# Patient Record
Sex: Female | Born: 1955 | Race: Black or African American | Hispanic: No | Marital: Single | Smoking: Never smoker
Health system: Southern US, Community
[De-identification: ages and names within clinical notes are randomized; demographics above are authoritative.]

## PROBLEM LIST (undated history)

## (undated) DIAGNOSIS — G4730 Sleep apnea, unspecified: Secondary | ICD-10-CM

## (undated) DIAGNOSIS — Z8489 Family history of other specified conditions: Secondary | ICD-10-CM

## (undated) DIAGNOSIS — I10 Essential (primary) hypertension: Secondary | ICD-10-CM

## (undated) DIAGNOSIS — F329 Major depressive disorder, single episode, unspecified: Secondary | ICD-10-CM

## (undated) DIAGNOSIS — Z8619 Personal history of other infectious and parasitic diseases: Secondary | ICD-10-CM

## (undated) DIAGNOSIS — K859 Acute pancreatitis without necrosis or infection, unspecified: Secondary | ICD-10-CM

## (undated) DIAGNOSIS — M1990 Unspecified osteoarthritis, unspecified site: Secondary | ICD-10-CM

## (undated) DIAGNOSIS — F32A Depression, unspecified: Secondary | ICD-10-CM

## (undated) DIAGNOSIS — F419 Anxiety disorder, unspecified: Secondary | ICD-10-CM

## (undated) HISTORY — DX: Essential (primary) hypertension: I10

## (undated) HISTORY — PX: HARDWARE REMOVAL: SHX979

## (undated) HISTORY — DX: Depression, unspecified: F32.A

## (undated) HISTORY — DX: Personal history of other infectious and parasitic diseases: Z86.19

## (undated) HISTORY — PX: KNEE SURGERY: SHX244

## (undated) HISTORY — DX: Major depressive disorder, single episode, unspecified: F32.9

---

## 2006-08-19 ENCOUNTER — Other Ambulatory Visit: Payer: Self-pay

## 2006-08-19 ENCOUNTER — Inpatient Hospital Stay: Payer: Self-pay | Admitting: Internal Medicine

## 2006-08-20 ENCOUNTER — Other Ambulatory Visit: Payer: Self-pay

## 2007-10-11 ENCOUNTER — Ambulatory Visit: Payer: Self-pay | Admitting: Internal Medicine

## 2007-11-29 ENCOUNTER — Ambulatory Visit: Payer: Self-pay | Admitting: Internal Medicine

## 2008-04-08 ENCOUNTER — Ambulatory Visit: Payer: Self-pay

## 2008-08-15 ENCOUNTER — Emergency Department: Payer: Self-pay | Admitting: Emergency Medicine

## 2009-01-15 DIAGNOSIS — I10 Essential (primary) hypertension: Secondary | ICD-10-CM | POA: Insufficient documentation

## 2009-01-15 DIAGNOSIS — G4730 Sleep apnea, unspecified: Secondary | ICD-10-CM | POA: Insufficient documentation

## 2009-01-15 DIAGNOSIS — E119 Type 2 diabetes mellitus without complications: Secondary | ICD-10-CM | POA: Insufficient documentation

## 2009-01-29 DIAGNOSIS — K219 Gastro-esophageal reflux disease without esophagitis: Secondary | ICD-10-CM | POA: Insufficient documentation

## 2009-04-08 DIAGNOSIS — R202 Paresthesia of skin: Secondary | ICD-10-CM | POA: Insufficient documentation

## 2009-09-07 DIAGNOSIS — J309 Allergic rhinitis, unspecified: Secondary | ICD-10-CM | POA: Insufficient documentation

## 2010-06-30 ENCOUNTER — Ambulatory Visit: Payer: Self-pay

## 2010-07-22 ENCOUNTER — Encounter: Payer: Self-pay | Admitting: Cardiovascular Disease

## 2010-07-29 ENCOUNTER — Encounter: Payer: Self-pay | Admitting: Cardiovascular Disease

## 2010-08-17 ENCOUNTER — Encounter: Payer: Self-pay | Admitting: Cardiovascular Disease

## 2010-08-17 ENCOUNTER — Ambulatory Visit (INDEPENDENT_AMBULATORY_CARE_PROVIDER_SITE_OTHER): Payer: Self-pay | Admitting: Cardiovascular Disease

## 2010-08-17 DIAGNOSIS — E669 Obesity, unspecified: Secondary | ICD-10-CM

## 2010-08-17 DIAGNOSIS — R0602 Shortness of breath: Secondary | ICD-10-CM | POA: Insufficient documentation

## 2010-08-17 DIAGNOSIS — E119 Type 2 diabetes mellitus without complications: Secondary | ICD-10-CM | POA: Insufficient documentation

## 2010-08-17 DIAGNOSIS — I10 Essential (primary) hypertension: Secondary | ICD-10-CM | POA: Insufficient documentation

## 2010-08-17 DIAGNOSIS — I152 Hypertension secondary to endocrine disorders: Secondary | ICD-10-CM | POA: Insufficient documentation

## 2010-08-17 DIAGNOSIS — E1159 Type 2 diabetes mellitus with other circulatory complications: Secondary | ICD-10-CM | POA: Insufficient documentation

## 2010-08-17 NOTE — Progress Notes (Signed)
   Patient ID: April Harrison, female    DOB: Dec 01, 1955, 55 y.o.   MRN: [id number]  HPI Comments: 55 year old female with a history of morbid obesity, obstructive sleep apnea, on CPAP, diabetes, hypertension, family history of coronary artery disease per the patient who presents for evaluation of shortness of breath, and for routine health maintenance given her strong family history.  She reports that she is unable to walk significant distances as she gets short of breath. She does not do any exercise. She does not believe that her CPAP is working well. She gets claustrophobic with the facemask reports that the nasal pillow is leaking air. She has had a difficult time walking on a regular basis as she reports chronic back and leg pain in her joints. No significant chest pain. She does report having a stress test 2-3 years ago which was reportedly normal. This was done with Dr. Park Breed.   She reports a recent hemoglobin A1c of 7. Previous hemoglobin A1c's have been higher than that in the 9 range.   EKG shows normal sinus rhythm with rate 76 beats per minute with rare PVC, no significant ST or T wave changes     Review of Systems  Constitutional: Positive for unexpected weight change.  HENT: Negative.   Eyes: Negative.   Respiratory: Positive for apnea and shortness of breath.   Cardiovascular: Positive for palpitations.  Gastrointestinal: Negative.   Musculoskeletal: Negative.   Skin: Negative.   Neurological: Negative.   Hematological: Negative.   Psychiatric/Behavioral: Negative.   All other systems reviewed and are negative.   BP 132/82  Pulse 76  Ht 5' (1.524 m)  Wt 275 lb 12.8 oz (125.102 kg)  BMI 53.86 kg/m2  Physical Exam  Nursing note and vitals reviewed. Constitutional: She is oriented to person, place, and time. She appears well-developed and well-nourished.       Morbidly obese.  HENT:  Head: Normocephalic.  Nose: Nose normal.  Mouth/Throat: Oropharynx is clear and  moist.  Eyes: Conjunctivae are normal. Pupils are equal, round, and reactive to light.  Neck: Normal range of motion. Neck supple. No JVD present.  Cardiovascular: Normal rate, regular rhythm, normal heart sounds and intact distal pulses.  Exam reveals no gallop and no friction rub.   No murmur heard. Pulmonary/Chest: Effort normal and breath sounds normal. No respiratory distress. She has no wheezes. She has no rales. She exhibits no tenderness.  Abdominal: Soft. Bowel sounds are normal. She exhibits no distension. There is no tenderness.  Musculoskeletal: Normal range of motion. She exhibits no edema and no tenderness.  Lymphadenopathy:    She has no cervical adenopathy.  Neurological: She is alert and oriented to person, place, and time. Coordination normal.  Skin: Skin is warm and dry. No rash noted. No erythema.  Psychiatric: She has a normal mood and affect. Her behavior is normal. Judgment and thought content normal.         Assessment and Plan

## 2010-08-17 NOTE — Assessment & Plan Note (Signed)
Her obesity is her main problem. This is affecting her diabetes, sleep apnea, overall functioning as she does have significant shortness of breath. We encouraged her to lose weight with exercise, diet and consider surgical options. She does not have insurance to cover that at this time.

## 2010-08-17 NOTE — Assessment & Plan Note (Signed)
Shortness of breath is likely secondary to underlying obesity and deconditioning. We have encouraged her to lose weight and increase her exercise.

## 2010-08-17 NOTE — Patient Instructions (Signed)
You are doing well. No medication changes were made. Please call us if you have new issues that need to be addressed before your next appt.  We will call you for a follow up Appt.   

## 2010-08-17 NOTE — Assessment & Plan Note (Signed)
We have encouraged her to continue to work on her diabetes. She does report periods of poor diabetes control in the past.

## 2010-08-17 NOTE — Assessment & Plan Note (Signed)
Blood pressure is well controlled on today's visit. No changes made to the medications. 

## 2010-09-13 ENCOUNTER — Encounter: Payer: Self-pay | Admitting: Cardiovascular Disease

## 2010-10-17 ENCOUNTER — Emergency Department: Payer: Self-pay | Admitting: Emergency Medicine

## 2010-11-29 ENCOUNTER — Encounter: Payer: Self-pay | Admitting: Family Medicine

## 2010-12-08 ENCOUNTER — Encounter: Payer: Self-pay | Admitting: Family Medicine

## 2011-08-19 ENCOUNTER — Emergency Department: Payer: Self-pay | Admitting: Emergency Medicine

## 2011-11-18 ENCOUNTER — Ambulatory Visit: Payer: Self-pay | Admitting: Family Medicine

## 2012-12-05 DIAGNOSIS — F413 Other mixed anxiety disorders: Secondary | ICD-10-CM | POA: Insufficient documentation

## 2013-11-20 ENCOUNTER — Ambulatory Visit: Payer: Self-pay

## 2014-08-08 ENCOUNTER — Ambulatory Visit: Payer: Self-pay

## 2015-12-13 ENCOUNTER — Emergency Department
Admission: EM | Admit: 2015-12-13 | Discharge: 2015-12-13 | Disposition: A | Discharge status: Home / Self Care | Payer: Medicare Other | Attending: Emergency Medicine | Admitting: Emergency Medicine

## 2015-12-13 DIAGNOSIS — Z7982 Long term (current) use of aspirin: Secondary | ICD-10-CM | POA: Insufficient documentation

## 2015-12-13 DIAGNOSIS — E109 Type 1 diabetes mellitus without complications: Secondary | ICD-10-CM | POA: Insufficient documentation

## 2015-12-13 DIAGNOSIS — Z7951 Long term (current) use of inhaled steroids: Secondary | ICD-10-CM | POA: Diagnosis not present

## 2015-12-13 DIAGNOSIS — Z794 Long term (current) use of insulin: Secondary | ICD-10-CM | POA: Insufficient documentation

## 2015-12-13 DIAGNOSIS — I10 Essential (primary) hypertension: Secondary | ICD-10-CM | POA: Diagnosis not present

## 2015-12-13 DIAGNOSIS — M545 Low back pain: Secondary | ICD-10-CM | POA: Diagnosis not present

## 2015-12-13 DIAGNOSIS — G8929 Other chronic pain: Secondary | ICD-10-CM | POA: Insufficient documentation

## 2015-12-13 DIAGNOSIS — M25512 Pain in left shoulder: Secondary | ICD-10-CM | POA: Diagnosis present

## 2015-12-13 MED ORDER — MELOXICAM 15 MG PO TABS: 15.0000 mg | ORAL_TABLET | Freq: Every day | ORAL | 0 refills | Status: DC

## 2015-12-13 MED ORDER — BACLOFEN 10 MG PO TABS: 10.0000 mg | ORAL_TABLET | Freq: Three times a day (TID) | ORAL | 0 refills | Status: DC

## 2015-12-13 NOTE — ED Notes (Signed)
PA at bedside.

## 2015-12-13 NOTE — ED Provider Notes (Signed)
[facility] Emergency Department Provider Note ____________________________________________  Time seen: Approximately 10:39 PM  I have reviewed the triage vital signs and the nursing notes.   HISTORY  Chief Complaint Joint Pain    HPI April Harrison is a 60 y.o. female who presents to the emergency department for chronic pain management. She states that she has had pain over her entire body for many years. She states that over the past 3 weeks, the pain in her right lower back and left shoulder has been increasing. She was taking ibuprofen, but that was not providing her any relief so she stopped taking it. She has not taken anything in its place. She has not scheduled a follow-up appointment with her primary care provider for these complaints. She denies new injuries. She denies fever.  Past Medical History:  Diagnosis Date  . Depression   . Diabetes mellitus    Type I  . History of chicken pox   . Hypertension   . Measles   . Mumps     Patient Active Problem List   Diagnosis Date Noted  . SOB (shortness of breath) 08/17/2010  . Obesity 08/17/2010  . HTN (hypertension) 08/17/2010  . Diabetes mellitus 08/17/2010    Past Surgical History:  Procedure Laterality Date  . CESAREAN SECTION    . KNEE SURGERY      Prior to Admission medications   Medication Sig Start Date End Date Taking? Authorizing Provider  albuterol (PROVENTIL,VENTOLIN) 90 MCG/ACT inhaler Inhale 2 puffs into the lungs every 6 (six) hours as needed.      Historical Provider, MD  aspirin 81 MG tablet Take 81 mg by mouth daily.      Historical Provider, MD  baclofen (LIORESAL) 10 MG tablet Take 1 tablet (10 mg total) by mouth 3 (three) times daily. 12/13/15   Victorino Dike, FNP  cetirizine (ZYRTEC) 10 MG tablet Take 10 mg by mouth daily.      Historical Provider, MD  glucose blood (TRUETRACK TEST) test strip 1 each by Other route as needed. Use as instructed     Historical Provider,  MD  insulin glargine (LANTUS) 100 UNIT/ML injection Inject 20 Units into the skin daily.      Historical Provider, MD  lisinopril-hydrochlorothiazide (PRINZIDE,ZESTORETIC) 20-25 MG per tablet Take 1 tablet by mouth daily.      Historical Provider, MD  meloxicam (MOBIC) 15 MG tablet Take 1 tablet (15 mg total) by mouth daily. 12/13/15   Victorino Dike, FNP  metFORMIN (GLUMETZA) 500 MG (MOD) 24 hr tablet Take 1,500 mg by mouth daily with breakfast.      Historical Provider, MD  metoprolol (LOPRESSOR) 50 MG tablet Take 25 mg by mouth 2 (two) times daily.      Historical Provider, MD  sertraline (ZOLOFT) 50 MG tablet Take 25 mg by mouth daily.      Historical Provider, MD    Allergies Review of patient's allergies indicates no known allergies.  No family history on file.  Social History Social History  Substance Use Topics  . Smoking status: Never Smoker  . Smokeless tobacco: Never Used  . Alcohol use No    Review of Systems Constitutional: No recent illness. Cardiovascular: Denies chest pain or palpitations. Respiratory: Denies shortness of breath. Musculoskeletal: Pain Diffuse from head to toe Skin: Negative for rash, wound, lesion. Neurological: Negative for focal weakness or numbness.  ____________________________________________   PHYSICAL EXAM:  VITAL SIGNS: ED Triage Vitals  Enc Vitals Group  BP 12/13/15 2159 139/64     Pulse --      Resp --      Temp 12/13/15 2159 98.7 F (37.1 C)     Temp Source 12/13/15 2159 Oral     SpO2 12/13/15 2159 97 %     Weight 12/13/15 2159 285 lb (129.3 kg)     Height 12/13/15 2159 4\' 11"  (1.499 m)     Head Circumference --      Peak Flow --      Pain Score 12/13/15 2200 5     Pain Loc --      Pain Edu? --      Excl. in [place]? --     Constitutional: Alert and oriented. Well appearing and in no acute distress. Eyes: Conjunctivae are normal. EOMI. Head: Atraumatic. Neck: No stridor.  Respiratory: Normal respiratory effort.    Musculoskeletal: Full range of motion throughout with 5+ strength in all extremities. Neurologic:  Normal speech and language. No gross focal neurologic deficits are appreciated. Speech is normal. No gait instability. Skin:  Skin is warm, dry and intact. Atraumatic. Psychiatric: Mood and affect are normal. Speech and behavior are normal.  ____________________________________________   LABS (all labs ordered are listed, but only abnormal results are displayed)  Labs Reviewed - No data to display ____________________________________________  RADIOLOGY  Not indicated ____________________________________________   PROCEDURES  Procedure(s) performed: None   ____________________________________________   INITIAL IMPRESSION / ASSESSMENT AND PLAN / ED COURSE  Pertinent labs & imaging results that were available during my care of the patient were reviewed by me and considered in my medical decision making (see chart for details).  Visit was given a prescription for baclofen for what she described as a muscle spasm that triggered the increase in pain in her left shoulder and upper arm. She was given a prescription for meloxicam as well. She was instructed to call her primary care provider and discuss  her chronic pain. She was instructed to return to the emergency department for symptoms that change or worsen or some unable schedule an appointment with her primary care provider. ____________________________________________   FINAL CLINICAL IMPRESSION(S) / ED DIAGNOSES  Final diagnoses:  Chronic pain       Victorino Dike, FNP 12/13/15 2317    Orbie Pyo, MD 12/13/15 [phone]

## 2015-12-13 NOTE — ED Triage Notes (Addendum)
Pt presents to ED with c/o pain to multiple joints x1 month with the pain "steadily increasing". Pt reports pain in both shoulders and elbows, lower back, bilateral knees, "I might as well just say the whole body". Pt reports having a PCP but "hasn't been able to see him". Pt denies any d/x of Gout; denies any known injuries or trauma, just generalized aches all over.

## 2015-12-25 DIAGNOSIS — M25519 Pain in unspecified shoulder: Secondary | ICD-10-CM | POA: Insufficient documentation

## 2016-02-01 ENCOUNTER — Other Ambulatory Visit: Payer: Self-pay | Admitting: Family Medicine

## 2016-02-01 DIAGNOSIS — Z1231 Encounter for screening mammogram for malignant neoplasm of breast: Secondary | ICD-10-CM

## 2016-02-25 ENCOUNTER — Ambulatory Visit
Admission: RE | Admit: 2016-02-25 | Discharge: 2016-02-25 | Disposition: A | Discharge status: Home / Self Care | Payer: Medicare Other | Source: Ambulatory Visit | Attending: Family Medicine | Admitting: Family Medicine

## 2016-02-25 DIAGNOSIS — Z1231 Encounter for screening mammogram for malignant neoplasm of breast: Secondary | ICD-10-CM | POA: Diagnosis present

## 2016-03-18 DIAGNOSIS — G8929 Other chronic pain: Secondary | ICD-10-CM | POA: Insufficient documentation

## 2017-02-13 ENCOUNTER — Other Ambulatory Visit: Payer: Self-pay | Admitting: Family Medicine

## 2017-02-13 DIAGNOSIS — Z1231 Encounter for screening mammogram for malignant neoplasm of breast: Secondary | ICD-10-CM

## 2017-03-17 DIAGNOSIS — Z0000 Encounter for general adult medical examination without abnormal findings: Secondary | ICD-10-CM

## 2017-03-29 ENCOUNTER — Ambulatory Visit
Admission: RE | Admit: 2017-03-29 | Discharge: 2017-03-29 | Disposition: A | Discharge status: Home / Self Care | Payer: Medicare Other | Source: Ambulatory Visit | Attending: Family Medicine | Admitting: Family Medicine

## 2017-03-29 DIAGNOSIS — Z1231 Encounter for screening mammogram for malignant neoplasm of breast: Secondary | ICD-10-CM | POA: Diagnosis present

## 2017-06-13 DIAGNOSIS — M25552 Pain in left hip: Secondary | ICD-10-CM | POA: Insufficient documentation

## 2017-06-20 ENCOUNTER — Other Ambulatory Visit: Payer: Self-pay

## 2017-06-23 DIAGNOSIS — M5136 Other intervertebral disc degeneration, lumbar region: Secondary | ICD-10-CM | POA: Insufficient documentation

## 2017-06-23 DIAGNOSIS — M51369 Other intervertebral disc degeneration, lumbar region without mention of lumbar back pain or lower extremity pain: Secondary | ICD-10-CM | POA: Insufficient documentation

## 2017-06-27 ENCOUNTER — Telehealth: Payer: Self-pay | Admitting: Gastroenterology

## 2017-06-27 NOTE — Telephone Encounter (Signed)
Patient called in & l/m wanting to schedule an appointment.She is in the referrals under her name being ref'd for a colonoscopy by Princella Ion.

## 2017-06-28 NOTE — Telephone Encounter (Signed)
LVM requesting a call back to schedule patient for her colonoscopy.

## 2017-07-04 ENCOUNTER — Telehealth: Payer: Self-pay | Admitting: Gastroenterology

## 2017-07-04 NOTE — Telephone Encounter (Signed)
Returned patients call LVM for her to call me back to schedule her colonoscopy.

## 2017-07-04 NOTE — Telephone Encounter (Signed)
Please call patient at [phone] to schedule her colonoscopy. She is returning our call

## 2017-07-17 ENCOUNTER — Other Ambulatory Visit: Payer: Self-pay

## 2017-07-17 ENCOUNTER — Telehealth: Payer: Self-pay

## 2017-07-17 DIAGNOSIS — Z1211 Encounter for screening for malignant neoplasm of colon: Secondary | ICD-10-CM

## 2017-07-17 NOTE — Telephone Encounter (Signed)
Gastroenterology Pre-Procedure Review  Request Date: 07/21/17 Requesting Physician: Dr. Vicente Males  PATIENT REVIEW QUESTIONS: The patient responded to the following health history questions as indicated:    1. Are you having any GI issues? yes (Unsure how long ago) 2. Do you have a personal history of Polyps? no 3. Do you have a family history of Colon Cancer or Polyps? yes (insulin) 4. Diabetes Mellitus? no 5. Joint replacements in the past 12 months?no 6. Major health problems in the past 3 months?no 7. Any artificial heart valves, MVP, or defibrillator?no    MEDICATIONS & ALLERGIES:    Patient reports the following regarding taking any anticoagulation/antiplatelet therapy:   Plavix, Coumadin, Eliquis, Xarelto, Lovenox, Pradaxa, Brilinta, or Effient? no Aspirin? yes (81 mg)  Patient confirms/reports the following medications:  Current Outpatient Medications  Medication Sig Dispense Refill  . albuterol (PROVENTIL,VENTOLIN) 90 MCG/ACT inhaler Inhale 2 puffs into the lungs every 6 (six) hours as needed.      Marland Kitchen aspirin 81 MG tablet Take 81 mg by mouth daily.      . baclofen (LIORESAL) 10 MG tablet Take 1 tablet (10 mg total) by mouth 3 (three) times daily. 30 tablet 0  . cetirizine (ZYRTEC) 10 MG tablet Take 10 mg by mouth daily.      Marland Kitchen glucose blood (TRUETRACK TEST) test strip 1 each by Other route as needed. Use as instructed     . insulin glargine (LANTUS) 100 UNIT/ML injection Inject 20 Units into the skin daily.      Marland Kitchen lisinopril-hydrochlorothiazide (PRINZIDE,ZESTORETIC) 20-25 MG per tablet Take 1 tablet by mouth daily.      . meloxicam (MOBIC) 15 MG tablet Take 1 tablet (15 mg total) by mouth daily. 30 tablet 0  . metFORMIN (GLUMETZA) 500 MG (MOD) 24 hr tablet Take 1,500 mg by mouth daily with breakfast.      . metoprolol (LOPRESSOR) 50 MG tablet Take 25 mg by mouth 2 (two) times daily.      . sertraline (ZOLOFT) 50 MG tablet Take 25 mg by mouth daily.       No current  facility-administered medications for this visit.     Patient confirms/reports the following allergies:  No Known Allergies  No orders of the defined types were placed in this encounter.   AUTHORIZATION INFORMATION Primary Insurance: 1D#: Group #:  Secondary Insurance: 1D#: Group #:  SCHEDULE INFORMATION: Date: 07/21/17 Time: Location:[facility]

## 2017-07-19 ENCOUNTER — Telehealth: Payer: Self-pay | Admitting: Gastroenterology

## 2017-07-19 ENCOUNTER — Other Ambulatory Visit: Payer: Self-pay

## 2017-07-19 MED ORDER — PEG 3350-KCL-NABCB-NACL-NASULF 236 G PO SOLR: 4000.0000 mL | Freq: Once | ORAL | 0 refills | Status: AC

## 2017-07-19 MED ORDER — NA SULFATE-K SULFATE-MG SULF 17.5-3.13-1.6 GM/177ML PO SOLN: 1.0000 | Freq: Once | ORAL | 0 refills | Status: AC

## 2017-07-19 NOTE — Telephone Encounter (Signed)
Pt left vm she states she has not received instructiuons for procedure for 07-21-17 please call pt at [phone]

## 2017-07-21 ENCOUNTER — Ambulatory Visit: Payer: Medicare Other | Admitting: Anesthesiology

## 2017-07-21 ENCOUNTER — Ambulatory Visit
Admission: RE | Admit: 2017-07-21 | Discharge: 2017-07-21 | Disposition: A | Discharge status: Home / Self Care | Payer: Medicare Other | Source: Ambulatory Visit | Attending: Gastroenterology | Admitting: Gastroenterology

## 2017-07-21 ENCOUNTER — Encounter
Admission: RE | Disposition: A | Discharge status: Home / Self Care | Payer: Self-pay | Source: Ambulatory Visit | Attending: Gastroenterology

## 2017-07-21 DIAGNOSIS — Z79899 Other long term (current) drug therapy: Secondary | ICD-10-CM | POA: Diagnosis not present

## 2017-07-21 DIAGNOSIS — Z7982 Long term (current) use of aspirin: Secondary | ICD-10-CM | POA: Diagnosis not present

## 2017-07-21 DIAGNOSIS — F329 Major depressive disorder, single episode, unspecified: Secondary | ICD-10-CM | POA: Insufficient documentation

## 2017-07-21 DIAGNOSIS — I10 Essential (primary) hypertension: Secondary | ICD-10-CM | POA: Diagnosis not present

## 2017-07-21 DIAGNOSIS — Z791 Long term (current) use of non-steroidal anti-inflammatories (NSAID): Secondary | ICD-10-CM | POA: Insufficient documentation

## 2017-07-21 DIAGNOSIS — Z794 Long term (current) use of insulin: Secondary | ICD-10-CM | POA: Insufficient documentation

## 2017-07-21 DIAGNOSIS — Z1211 Encounter for screening for malignant neoplasm of colon: Secondary | ICD-10-CM | POA: Diagnosis not present

## 2017-07-21 DIAGNOSIS — Z0000 Encounter for general adult medical examination without abnormal findings: Secondary | ICD-10-CM

## 2017-07-21 DIAGNOSIS — E1051 Type 1 diabetes mellitus with diabetic peripheral angiopathy without gangrene: Secondary | ICD-10-CM | POA: Insufficient documentation

## 2017-07-21 DIAGNOSIS — Z6841 Body Mass Index (BMI) 40.0 and over, adult: Secondary | ICD-10-CM | POA: Diagnosis not present

## 2017-07-21 DIAGNOSIS — G4730 Sleep apnea, unspecified: Secondary | ICD-10-CM | POA: Insufficient documentation

## 2017-07-21 HISTORY — DX: Sleep apnea, unspecified: G47.30

## 2017-07-21 HISTORY — PX: COLONOSCOPY WITH PROPOFOL: SHX5780

## 2017-07-21 LAB — GLUCOSE, CAPILLARY: Glucose-Capillary: 100 mg/dL — ABNORMAL HIGH (ref 65–99)

## 2017-07-21 SURGERY — COLONOSCOPY WITH PROPOFOL
Anesthesia: General

## 2017-07-21 MED ORDER — PROPOFOL 10 MG/ML IV BOLUS
INTRAVENOUS | Status: DC | PRN
  Administered 2017-07-21: 70 mg via INTRAVENOUS

## 2017-07-21 MED ORDER — PROPOFOL 500 MG/50ML IV EMUL
INTRAVENOUS | Status: DC | PRN
  Administered 2017-07-21: 160 ug/kg/min via INTRAVENOUS

## 2017-07-21 MED ORDER — SODIUM CHLORIDE 0.9 % IV SOLN
INTRAVENOUS | Status: DC
  Administered 2017-07-21: 14:00:00 via INTRAVENOUS

## 2017-07-21 NOTE — Anesthesia Post-op Follow-up Note (Signed)
Anesthesia QCDR form completed.        

## 2017-07-21 NOTE — Anesthesia Preprocedure Evaluation (Signed)
Anesthesia Evaluation  Patient identified by MRN, date of birth, ID band Patient awake    Reviewed: Allergy & Precautions, H&P , NPO status , reviewed documented beta blocker date and time   Airway Mallampati: II  TM Distance: >3 FB     Dental  (+) Chipped   Pulmonary sleep apnea and Continuous Positive Airway Pressure Ventilation ,     + decreased breath sounds      Cardiovascular hypertension, + Peripheral Vascular Disease       Neuro/Psych PSYCHIATRIC DISORDERS Depression    GI/Hepatic   Endo/Other  diabetes, Type 2Morbid obesity  Renal/GU      Musculoskeletal   Abdominal   Peds  Hematology   Anesthesia Other Findings   Reproductive/Obstetrics                             Anesthesia Physical Anesthesia Plan  ASA: III  Anesthesia Plan: General   Post-op Pain Management:    Induction:   PONV Risk Score and Plan: 3 and Propofol infusion  Airway Management Planned:   Additional Equipment:   Intra-op Plan:   Post-operative Plan:   Informed Consent: I have reviewed the patients History and Physical, chart, labs and discussed the procedure including the risks, benefits and alternatives for the proposed anesthesia with the patient or authorized representative who has indicated his/her understanding and acceptance.   Dental Advisory Given  Plan Discussed with: CRNA  Anesthesia Plan Comments:         Anesthesia Quick Evaluation

## 2017-07-21 NOTE — H&P (Signed)
April Darby, MD [address]  [place]  [place], [place] [address]  Main: [phone]  Fax: [phone] Pager: [phone]  Primary Care Physician:  [facility] Primary Gastroenterologist:  Dr. Cephas Harrison  Pre-Procedure History & Physical: HPI:  April Harrison is a 62 y.o. female is here for an colonoscopy.   Past Medical History:  Diagnosis Date  . Depression   . Diabetes mellitus    Type I  . History of chicken pox   . Hypertension   . Measles   . Mumps   . Sleep apnea     Past Surgical History:  Procedure Laterality Date  . CESAREAN SECTION    . KNEE SURGERY      Prior to Admission medications   Medication Sig Start Date End Date Taking? Authorizing Provider  albuterol (PROVENTIL,VENTOLIN) 90 MCG/ACT inhaler Inhale 2 puffs into the lungs every 6 (six) hours as needed.     Yes [provider]  aspirin 81 MG tablet Take 81 mg by mouth daily.     Yes [provider]  insulin glargine (LANTUS) 100 UNIT/ML injection Inject 20 Units into the skin daily.     Yes [provider]  metFORMIN (GLUMETZA) 500 MG (MOD) 24 hr tablet Take 1,500 mg by mouth daily with breakfast.     Yes [provider]  metoprolol (LOPRESSOR) 50 MG tablet Take 25 mg by mouth 2 (two) times daily.     Yes [provider]  sertraline (ZOLOFT) 50 MG tablet Take 25 mg by mouth daily.     Yes [provider]  baclofen (LIORESAL) 10 MG tablet Take 1 tablet (10 mg total) by mouth 3 (three) times daily. 12/13/15   Triplett, Cari B, FNP  cetirizine (ZYRTEC) 10 MG tablet Take 10 mg by mouth daily.      [provider]  glucose blood (TRUETRACK TEST) test strip 1 each by Other route as needed. Use as instructed     [provider]  lisinopril-hydrochlorothiazide (PRINZIDE,ZESTORETIC) 20-25 MG per tablet Take 1 tablet by mouth daily.      [provider]  meloxicam (MOBIC) 15 MG tablet Take 1  tablet (15 mg total) by mouth daily. 12/13/15   Sherrie George B, FNP    Allergies as of 07/17/2017  . (No Known Allergies)    History reviewed. No pertinent family history.  Social History   Socioeconomic History  . Marital status: Single    Spouse name: Not on file  . Number of children: Not on file  . Years of education: Not on file  . Highest education level: Not on file  Social Needs  . Financial resource strain: Not on file  . Food insecurity - worry: Not on file  . Food insecurity - inability: Not on file  . Transportation needs - medical: Not on file  . Transportation needs - non-medical: Not on file  Occupational History  . Not on file  Tobacco Use  . Smoking status: Never Smoker  . Smokeless tobacco: Never Used  Substance and Sexual Activity  . Alcohol use: No  . Drug use: No  . Sexual activity: Not on file  Other Topics Concern  . Not on file  Social History Narrative  . Not on file    Review of Systems: See HPI, otherwise negative ROS  Physical Exam: BP (!) 155/80   Pulse 60   Temp (!) 96.1 F (35.6 C)   Resp (!)  22   Ht 4\' 11"  (1.499 m)   Wt 280 lb (127 kg)   SpO2 98%   BMI 56.55 kg/m  General:   Alert,  pleasant and cooperative in NAD Head:  Normocephalic and atraumatic. Neck:  Supple; no masses or thyromegaly. Lungs:  Clear throughout to auscultation.    Heart:  Regular rate and rhythm. Abdomen:  Soft, nontender and nondistended. Normal bowel sounds, without guarding, and without rebound.   Neurologic:  Alert and  oriented x4;  grossly normal neurologically.  Impression/Plan: April Harrison is here for an colonoscopy to be performed for colon cancer screening  Risks, benefits, limitations, and alternatives regarding  colonoscopy have been reviewed with the patient.  Questions have been answered.  All parties agreeable.   Sherri Sear, MD  07/21/2017, 2:54 PM

## 2017-07-21 NOTE — Transfer of Care (Signed)
Immediate Anesthesia Transfer of Care Note  Patient: April Harrison  Procedure(s) Performed: COLONOSCOPY WITH PROPOFOL (N/A )  Patient Location: PACU and Endoscopy Unit  Anesthesia Type:General  Level of Consciousness: drowsy and patient cooperative  Airway & Oxygen Therapy: Patient Spontanous Breathing and Patient connected to nasal cannula oxygen  Post-op Assessment: Report given to RN and Post -op Vital signs reviewed and stable  Post vital signs: Reviewed and stable  Last Vitals:  Vitals:   07/21/17 1418 07/21/17 1532  BP: (!) 155/80 127/69  Pulse: 60 71  Resp: (!) 22 18  Temp:  (!) 36.1 C  SpO2: 98% 100%    Last Pain:  Vitals:   07/21/17 1532  TempSrc: Tympanic         Complications: No apparent anesthesia complications

## 2017-07-21 NOTE — Op Note (Signed)
[facility] Gastroenterology Patient Name: April Harrison Procedure Date: 07/21/2017 3:05 PM MRN: [id number] Account #: [id number] Date of Birth: April 14, 1956 Admit Type: Outpatient Age: 62 Room: [facility] ENDO ROOM 3 Gender: Female Note Status: Finalized Procedure:            Colonoscopy Indications:          Screening for colorectal malignant neoplasm, This is                        the patient's first colonoscopy Providers:            Lin Landsman MD, MD Medicines:            Monitored Anesthesia Care Complications:        No immediate complications. Estimated blood loss: None. Procedure:            Pre-Anesthesia Assessment:                       - Prior to the procedure, a History and Physical was                        performed, and patient medications and allergies were                        reviewed. The patient is competent. The risks and                        benefits of the procedure and the sedation options and                        risks were discussed with the patient. All questions                        were answered and informed consent was obtained.                        Patient identification and proposed procedure were                        verified by the physician, the nurse, the                        anesthesiologist, the anesthetist and the technician in                        the pre-procedure area in the procedure room in the                        endoscopy suite. Mental Status Examination: alert and                        oriented. Airway Examination: normal oropharyngeal                        airway and neck mobility. Respiratory Examination:                        clear to auscultation. CV Examination: normal.  Prophylactic Antibiotics: The patient does not require                        prophylactic antibiotics. Prior Anticoagulants: The                        patient has taken no previous anticoagulant or                        antiplatelet agents. ASA Grade Assessment: III - A                        patient with severe systemic disease. After reviewing                        the risks and benefits, the patient was deemed in                        satisfactory condition to undergo the procedure. The                        anesthesia plan was to use monitored anesthesia care                        (MAC). Immediately prior to administration of                        medications, the patient was re-assessed for adequacy                        to receive sedatives. The heart rate, respiratory rate,                        oxygen saturations, blood pressure, adequacy of                        pulmonary ventilation, and response to care were                        monitored throughout the procedure. The physical status                        of the patient was re-assessed after the procedure.                       After obtaining informed consent, the colonoscope was                        passed under direct vision. Throughout the procedure,                        the patient's blood pressure, pulse, and oxygen                        saturations were monitored continuously. The                        Colonoscope was introduced through the anus and                        advanced  to the the cecum, identified by appendiceal                        orifice and ileocecal valve. The colonoscopy was                        performed with ease. The patient tolerated the                        procedure well. The quality of the bowel preparation                        was evaluated using the BBPS [facility] Bowel Preparation                        Scale) with scores of: Right Colon = 3, Transverse                        Colon = 3 and Left Colon = 3 (entire mucosa seen well                        with no residual staining, small fragments of stool or                        opaque liquid). The total BBPS score  equals 9. Findings:      The colon (entire examined portion) appeared normal.      The retroflexed view of the distal rectum and anal verge was normal and       showed no anal or rectal abnormalities. Impression:           - The entire examined colon is normal.                       - The distal rectum and anal verge are normal on                        retroflexion view.                       - No specimens collected. Recommendation:       - Discharge patient to home.                       - Resume previous diet today.                       - Continue present medications.                       - Repeat colonoscopy in 10 years for surveillance. Procedure Code(s):    --- Professional ---                       Y6063, Colorectal cancer screening; colonoscopy on                        individual not meeting criteria for high risk Diagnosis Code(s):    --- Professional ---                       Z12.11, Encounter for screening for  malignant neoplasm                        of colon CPT copyright 2016 American Medical Association. All rights reserved. The codes documented in this report are preliminary and upon coder review may  be revised to meet current compliance requirements. Dr. Ulyess Mort Lin Landsman MD, MD 07/21/2017 3:31:56 PM This report has been signed electronically. Number of Addenda: 0 Note Initiated On: 07/21/2017 3:05 PM Scope Withdrawal Time: 0 hours 11 minutes 46 seconds  Total Procedure Duration: 0 hours 14 minutes 32 seconds       [facility]

## 2017-07-21 NOTE — Anesthesia Postprocedure Evaluation (Signed)
Anesthesia Post Note  Patient: April Harrison  Procedure(s) Performed: COLONOSCOPY WITH PROPOFOL (N/A )  Patient location during evaluation: Endoscopy Anesthesia Type: General Level of consciousness: awake and alert Pain management: pain level controlled Vital Signs Assessment: post-procedure vital signs reviewed and stable Respiratory status: spontaneous breathing, nonlabored ventilation, respiratory function stable and patient connected to nasal cannula oxygen Cardiovascular status: blood pressure returned to baseline and stable Postop Assessment: no apparent nausea or vomiting Anesthetic complications: no     Last Vitals:  Vitals:   07/21/17 1542 07/21/17 1552  BP: (!) 139/111 (!) 152/85  Pulse: 67 66  Resp: 15 (!) 23  Temp:    SpO2: 100% 100%    Last Pain:  Vitals:   07/21/17 1532  TempSrc: Tympanic                 Codie Krogh S

## 2017-07-24 ENCOUNTER — Encounter: Payer: Self-pay | Admitting: Gastroenterology

## 2017-08-12 ENCOUNTER — Emergency Department
Admission: EM | Admit: 2017-08-12 | Discharge: 2017-08-12 | Disposition: A | Discharge status: Home / Self Care | Payer: Medicare Other | Attending: Emergency Medicine | Admitting: Emergency Medicine

## 2017-08-12 ENCOUNTER — Other Ambulatory Visit: Payer: Self-pay

## 2017-08-12 ENCOUNTER — Encounter: Payer: Self-pay | Admitting: Emergency Medicine

## 2017-08-12 ENCOUNTER — Emergency Department: Payer: Medicare Other

## 2017-08-12 DIAGNOSIS — L03116 Cellulitis of left lower limb: Secondary | ICD-10-CM | POA: Insufficient documentation

## 2017-08-12 DIAGNOSIS — I10 Essential (primary) hypertension: Secondary | ICD-10-CM | POA: Insufficient documentation

## 2017-08-12 DIAGNOSIS — E109 Type 1 diabetes mellitus without complications: Secondary | ICD-10-CM | POA: Diagnosis not present

## 2017-08-12 DIAGNOSIS — R2242 Localized swelling, mass and lump, left lower limb: Secondary | ICD-10-CM | POA: Diagnosis present

## 2017-08-12 LAB — COMPREHENSIVE METABOLIC PANEL
ALT: 23 U/L (ref 14–54)
AST: 25 U/L (ref 15–41)
Albumin: 3.7 g/dL (ref 3.5–5.0)
Alkaline Phosphatase: 67 U/L (ref 38–126)
Anion gap: 7 (ref 5–15)
BUN: 14 mg/dL (ref 6–20)
CO2: 30 mmol/L (ref 22–32)
Calcium: 8.9 mg/dL (ref 8.9–10.3)
Chloride: 104 mmol/L (ref 101–111)
Creatinine, Ser: 0.84 mg/dL (ref 0.44–1.00)
GFR calc Af Amer: >60 mL/min (ref >60)
GFR calc non Af Amer: >60 mL/min (ref >60)
Glucose, Bld: 131 mg/dL — ABNORMAL HIGH (ref 65–99)
Potassium: 3.7 mmol/L (ref 3.5–5.1)
Sodium: 141 mmol/L (ref 135–145)
Total Bilirubin: 0.7 mg/dL (ref 0.3–1.2)
Total Protein: 7.1 g/dL (ref 6.5–8.1)

## 2017-08-12 LAB — CBC WITH DIFFERENTIAL/PLATELET
Basophils Absolute: 0 10*3/uL (ref 0–0.1)
Basophils Relative: 1 %
Eosinophils Absolute: 0.3 10*3/uL (ref 0–0.7)
Eosinophils Relative: 5 %
HCT: 36.9 % (ref 35.0–47.0)
Hemoglobin: 11.9 g/dL — ABNORMAL LOW (ref 12.0–16.0)
Lymphocytes Relative: 17 %
Lymphs Abs: 1.3 10*3/uL (ref 1.0–3.6)
MCH: 24.6 pg — ABNORMAL LOW (ref 26.0–34.0)
MCHC: 32.2 g/dL (ref 32.0–36.0)
MCV: 76.3 fL — ABNORMAL LOW (ref 80.0–100.0)
Monocytes Absolute: 0.6 10*3/uL (ref 0.2–0.9)
Monocytes Relative: 8 %
Neutro Abs: 5.1 10*3/uL (ref 1.4–6.5)
Neutrophils Relative %: 69 %
Platelets: 185 10*3/uL (ref 150–440)
RBC: 4.84 MIL/uL (ref 3.80–5.20)
RDW: 16.4 % — ABNORMAL HIGH (ref 11.5–14.5)
WBC: 7.4 10*3/uL (ref 3.6–11.0)

## 2017-08-12 MED ORDER — SULFAMETHOXAZOLE-TRIMETHOPRIM 800-160 MG PO TABS: 1.0000 | ORAL_TABLET | Freq: Two times a day (BID) | ORAL | 0 refills | Status: DC

## 2017-08-12 MED ORDER — KETOROLAC TROMETHAMINE 30 MG/ML IJ SOLN
30.0000 mg | Freq: Once | INTRAMUSCULAR | Status: AC
  Administered 2017-08-12: 30 mg via INTRAVENOUS
  Filled 2017-08-12: qty 1

## 2017-08-12 MED ORDER — OXYCODONE-ACETAMINOPHEN 5-325 MG PO TABS: 1.0000 | ORAL_TABLET | Freq: Three times a day (TID) | ORAL | 0 refills | Status: DC | PRN

## 2017-08-12 MED ORDER — VANCOMYCIN HCL IN DEXTROSE 1-5 GM/200ML-% IV SOLN
1000.0000 mg | Freq: Once | INTRAVENOUS | Status: AC
  Administered 2017-08-12: 1000 mg via INTRAVENOUS
  Filled 2017-08-12: qty 200

## 2017-08-12 NOTE — ED Triage Notes (Signed)
Leg edema x 2 days. Denies fall or injury.

## 2017-08-12 NOTE — ED Provider Notes (Signed)
[facility] Emergency Department Provider Note       Time seen: ----------------------------------------- 1:25 PM on 08/12/2017 -----------------------------------------   I have reviewed the triage vital signs and the nursing notes.  HISTORY   Chief Complaint Leg Swelling    HPI April Harrison is a 62 y.o. female with a history of depression, diabetes, hypertension, measles and mumps who presents to the ED for edema for the past 2 days.  She denies any falls or injury.  Patient states pain started on Thursday and although it is not as bad as it was then she is still having persistent pain.  She has unilateral swelling, denies falls or trauma.  She had some shivering on Thursday but denies any fever or chills now.  She has never had a problem with this leg before.  Past Medical History:  Diagnosis Date  . Depression   . Diabetes mellitus    Type I  . History of chicken pox   . Hypertension   . Measles   . Mumps   . Sleep apnea     Patient Active Problem List   Diagnosis Date Noted  . Special screening for malignant neoplasms, colon   . SOB (shortness of breath) 08/17/2010  . Obesity 08/17/2010  . HTN (hypertension) 08/17/2010  . Diabetes mellitus 08/17/2010    Past Surgical History:  Procedure Laterality Date  . CESAREAN SECTION    . COLONOSCOPY WITH PROPOFOL N/A 07/21/2017   Procedure: COLONOSCOPY WITH PROPOFOL;  Surgeon: Lin Landsman, MD;  Location: [facility] ENDOSCOPY;  Service: Gastroenterology;  Laterality: N/A;  . KNEE SURGERY      Allergies Neurontin [gabapentin]  Social History Social History   Tobacco Use  . Smoking status: Never Smoker  . Smokeless tobacco: Never Used  Substance Use Topics  . Alcohol use: No  . Drug use: No   Review of Systems Constitutional: Negative for fever. Cardiovascular: Negative for chest pain. Respiratory: Negative for shortness of breath. Gastrointestinal: Negative for abdominal pain,  vomiting and diarrhea. Musculoskeletal: Positive for left leg pain and swelling Skin: Negative for rash. Neurological: Negative for headaches, focal weakness or numbness.  All systems negative/normal/unremarkable except as stated in the HPI  ____________________________________________   PHYSICAL EXAM:  VITAL SIGNS: ED Triage Vitals  Enc Vitals Group     BP 08/12/17 1041 (!) 145/82     Pulse Rate 08/12/17 1041 92     Resp 08/12/17 1041 20     Temp 08/12/17 1041 (!) 97.5 F (36.4 C)     Temp Source 08/12/17 1041 Oral     SpO2 08/12/17 1041 99 %     Weight 08/12/17 1043 272 lb (123.4 kg)     Height 08/12/17 1043 5' (1.524 m)     Head Circumference --      Peak Flow --      Pain Score 08/12/17 1043 8     Pain Loc --      Pain Edu? --      Excl. in [place]? --    Constitutional: Alert and oriented.  No distress Eyes: Conjunctivae are normal. Normal extraocular movements. Cardiovascular: Normal rate, regular rhythm. No murmurs, rubs, or gallops.  Normal peripheral pulses in both feet Respiratory: Normal respiratory effort without tachypnea nor retractions. Breath sounds are clear and equal bilaterally. No wheezes/rales/rhonchi. Gastrointestinal: Soft and nontender. Normal bowel sounds Musculoskeletal: Left leg is diffusely tender below the knee with unilateral swelling and mild erythema compared to the right leg Neurologic:  Normal speech and language. No gross focal neurologic deficits are appreciated.  Skin: Erythema and edema to the left leg Psychiatric: Mood and affect are normal. Speech and behavior are normal.  ____________________________________________  ED COURSE:  As part of my medical decision making, I reviewed the following data within the electronic MEDICAL RECORD NUMBER History obtained from family if available, nursing notes, old chart and ekg, as well as notes from prior ED visits. Patient presented for edema, we will assess with labs and imaging as indicated at this  time.   Procedures ____________________________________________   LABS (pertinent positives/negatives)  Labs Reviewed  CBC WITH DIFFERENTIAL/PLATELET - Abnormal; Notable for the following components:      Result Value   Hemoglobin 11.9 (*)    MCV 76.3 (*)    MCH 24.6 (*)    RDW 16.4 (*)    All other components within normal limits  COMPREHENSIVE METABOLIC PANEL - Abnormal; Notable for the following components:   Glucose, Bld 131 (*)    All other components within normal limits    RADIOLOGY  Left lower extremity ultrasound reveals no DVT  ____________________________________________  DIFFERENTIAL DIAGNOSIS   Peripheral edema, DVT, cellulitis, superficial phlebitis, muscle strain  FINAL ASSESSMENT AND PLAN  Cellulitis   Plan: The patient had presented for left leg swelling and pain. Patient's labs were unremarkable. Patient's imaging did not reveal any DVT.  She was started on IV antibiotics and her labs are reassuring.  Have advised follow-up on Monday for recheck.   Laurence Aly, MD   Note: This note was generated in part or whole with voice recognition software. Voice recognition is usually quite accurate but there are transcription errors that can and very often do occur. I apologize for any typographical errors that were not detected and corrected.     Earleen Newport, MD 08/12/17 1341

## 2017-09-11 DIAGNOSIS — M7989 Other specified soft tissue disorders: Secondary | ICD-10-CM | POA: Insufficient documentation

## 2017-09-11 DIAGNOSIS — F331 Major depressive disorder, recurrent, moderate: Secondary | ICD-10-CM | POA: Insufficient documentation

## 2017-09-12 ENCOUNTER — Ambulatory Visit
Admission: RE | Admit: 2017-09-12 | Discharge: 2017-09-12 | Disposition: A | Discharge status: Home / Self Care | Payer: Medicare Other | Source: Ambulatory Visit | Attending: Family Medicine | Admitting: Family Medicine

## 2017-09-12 ENCOUNTER — Other Ambulatory Visit: Payer: Self-pay | Admitting: Family Medicine

## 2017-09-12 DIAGNOSIS — M7989 Other specified soft tissue disorders: Secondary | ICD-10-CM | POA: Diagnosis present

## 2017-09-12 DIAGNOSIS — M19072 Primary osteoarthritis, left ankle and foot: Secondary | ICD-10-CM | POA: Insufficient documentation

## 2017-09-12 DIAGNOSIS — R52 Pain, unspecified: Secondary | ICD-10-CM

## 2017-09-12 DIAGNOSIS — M79672 Pain in left foot: Secondary | ICD-10-CM | POA: Diagnosis present

## 2018-02-23 DIAGNOSIS — B359 Dermatophytosis, unspecified: Secondary | ICD-10-CM | POA: Insufficient documentation

## 2018-05-17 ENCOUNTER — Other Ambulatory Visit: Payer: Self-pay | Admitting: Family Medicine

## 2018-05-17 DIAGNOSIS — Z1231 Encounter for screening mammogram for malignant neoplasm of breast: Secondary | ICD-10-CM

## 2018-05-24 ENCOUNTER — Ambulatory Visit
Admission: RE | Admit: 2018-05-24 | Discharge: 2018-05-24 | Disposition: A | Discharge status: Home / Self Care | Payer: Medicare Other | Source: Ambulatory Visit | Attending: Family Medicine | Admitting: Family Medicine

## 2018-05-24 DIAGNOSIS — Z1231 Encounter for screening mammogram for malignant neoplasm of breast: Secondary | ICD-10-CM | POA: Diagnosis present

## 2019-05-30 DIAGNOSIS — Z9181 History of falling: Secondary | ICD-10-CM | POA: Insufficient documentation

## 2019-06-11 ENCOUNTER — Other Ambulatory Visit: Payer: Self-pay | Admitting: Family Medicine

## 2019-06-11 DIAGNOSIS — Z1231 Encounter for screening mammogram for malignant neoplasm of breast: Secondary | ICD-10-CM

## 2019-06-15 DIAGNOSIS — R1030 Lower abdominal pain, unspecified: Secondary | ICD-10-CM | POA: Insufficient documentation

## 2019-07-05 ENCOUNTER — Ambulatory Visit (INDEPENDENT_AMBULATORY_CARE_PROVIDER_SITE_OTHER): Payer: Medicare Other | Admitting: Obstetrics and Gynecology

## 2019-07-05 ENCOUNTER — Encounter: Payer: Self-pay | Admitting: Obstetrics and Gynecology

## 2019-07-05 ENCOUNTER — Other Ambulatory Visit: Payer: Self-pay

## 2019-07-05 VITALS — BP 148/88 | HR 67 | Ht 59.0 in | Wt 298.0 lb

## 2019-07-05 DIAGNOSIS — N950 Postmenopausal bleeding: Secondary | ICD-10-CM | POA: Diagnosis not present

## 2019-07-05 NOTE — Progress Notes (Signed)
Gynecology H&P  Chief Complaint:  Chief Complaint  Patient presents with  . cervical polyps    Referred Princella Ion    History of Present Illness: Patient is a 64 y.o. G1P1 presents evaluation of postmenopausal bleeding seen in consultation at the request of her PCP at [facility]. The patient states she has had a multiple episode(s) of bleeding in the past 1 year(s).  The most recent episode occurred  1  month(s) ago.  The bleeding has been limited to spotting . She describes the blood as Bright red in appearance.  She has had cramping. She deniestrauma or other inciting event. She does not have a history of abnormal pap smears.  Her last pap smear was2 weeks ago records are not available for review.  There are no other aggravating factors reported. There are no alleviating factors reported. The patient's past medical history is notable for obesity.   She has not had prior work up for postmenopausal bleeding.  Review of Systems: 10 point review of systems negative unless otherwise noted in HPI  Past Medical History:  Past Medical History:  Diagnosis Date  . Depression   . Diabetes mellitus    Type I  . History of chicken pox   . Hypertension   . Measles   . Mumps   . Sleep apnea     Past Surgical History:  Past Surgical History:  Procedure Laterality Date  . CESAREAN SECTION    . COLONOSCOPY WITH PROPOFOL N/A 07/21/2017   Procedure: COLONOSCOPY WITH PROPOFOL;  Surgeon: Lin Landsman, MD;  Location: [facility] ENDOSCOPY;  Service: Gastroenterology;  Laterality: N/A;  . KNEE SURGERY      Family History:  Family History  Problem Relation Age of Onset  . Breast cancer Neg Hx     Social History:  Social History   Socioeconomic History  . Marital status: Single    Spouse name: Not on file  . Number of children: Not on file  . Years of education: Not on file  . Highest education level: Not on file  Occupational History  . Not on file    Tobacco Use  . Smoking status: Never Smoker  . Smokeless tobacco: Never Used  Substance and Sexual Activity  . Alcohol use: No  . Drug use: No  . Sexual activity: Not Currently    Birth control/protection: None  Other Topics Concern  . Not on file  Social History Narrative  . Not on file   Social Determinants of Health   Financial Resource Strain:   . Difficulty of Paying Living Expenses: Not on file  Food Insecurity:   . Worried About Charity fundraiser in the Last Year: Not on file  . Ran Out of Food in the Last Year: Not on file  Transportation Needs:   . Lack of Transportation (Medical): Not on file  . Lack of Transportation (Non-Medical): Not on file  Physical Activity:   . Days of Exercise per Week: Not on file  . Minutes of Exercise per Session: Not on file  Stress:   . Feeling of Stress : Not on file  Social Connections:   . Frequency of Communication with Friends and Family: Not on file  . Frequency of Social Gatherings with Friends and Family: Not on file  . Attends Religious Services: Not on file  . Active Member of Clubs or Organizations: Not on file  . Attends Archivist Meetings: Not on file  .  Marital Status: Not on file  Intimate Partner Violence:   . Fear of Current or Ex-Partner: Not on file  . Emotionally Abused: Not on file  . Physically Abused: Not on file  . Sexually Abused: Not on file    Allergies:  Allergies  Allergen Reactions  . Neurontin [Gabapentin] Rash    Medications: Prior to Admission medications   Medication Sig Start Date End Date Taking? Authorizing Provider  albuterol (PROVENTIL,VENTOLIN) 90 MCG/ACT inhaler Inhale 2 puffs into the lungs every 6 (six) hours as needed.     Yes [provider]  aspirin 81 MG tablet Take 81 mg by mouth daily.     Yes [provider]  cetirizine (ZYRTEC) 10 MG tablet Take 10 mg by mouth daily.     Yes [provider]  glucose blood (TRUETRACK TEST) test strip  1 each by Other route as needed. Use as instructed    Yes [provider]  insulin glargine (LANTUS) 100 UNIT/ML injection Inject 40 Units into the skin 2 (two) times daily.    Yes [provider]  lisinopril-hydrochlorothiazide (PRINZIDE,ZESTORETIC) 20-25 MG per tablet Take 1 tablet by mouth daily.     Yes [provider]  meloxicam (MOBIC) 15 MG tablet Take 1 tablet (15 mg total) by mouth daily. 12/13/15  Yes Triplett, Cari B, FNP  metoprolol tartrate (LOPRESSOR) 25 MG tablet Take 25 mg by mouth 2 (two) times daily.     Yes [provider]  sertraline (ZOLOFT) 50 MG tablet Take 50 mg by mouth daily.    Yes [provider]  TRULICITY 1.5 0000000 SOPN  06/28/19  Yes [provider]    Physical Exam Vitals: Blood pressure (!) 148/88, pulse 67, height 4\' 11"  (1.499 m), weight 298 lb (135.2 kg).  General: NAD, obese, appears stated age 71: normocephalic, anicteric Pulmonary: No increased work of breathing Genitourinary:  External: Normal external female genitalia.  Normal urethral meatus, normal  Bartholin's and Skene's glands.    Vagina: Normal vaginal mucosa, no evidence of prolapse.    Cervix: Grossly normal in appearance, no bleeding  Uterus: Non-enlarged, mobile, normal contour.  No CMT  Adnexa: ovaries non-enlarged, no adnexal masses  Rectal: deferred Extremities: no edema, erythema, or tenderness Neurologic: Grossly intact Psychiatric: mood appropriate, affect full  Assessment: 64 y.o. G1P1 presenting for evaluation of postmenopausal bleeding  Plan: Problem List Items Addressed This Visit    None    Visit Diagnoses    Postmenopausal bleeding    -  Primary   Relevant Orders   US PELVIS TRANSVAGINAL NON-OB (TV ONLY)      1) We discussed that menopause is a clinical diagnosis made after 12 months of amenorrhea.  The average age of menopause in the  General [place] population is 23 but there may be significant variation.  Any  bleeding that happens after a 12 month period of amenorrhea warrants further work.  Possible etiologies of postmenopausal bleeding were discussed with the patient today.  These may range from benign etiologies such as urethral prolapse and atrophy, to indeterminate lesions such as submucosal fibroids or polyps which would require resection to accurately evaluate. The role of unopposed estrogen in the development of  dndometrial hyperplasia or carcinoma is discussed.  The risk of endometrial hyperplasia is linearly correlated with increasing BMI given the production of estrone by adipose tissue.  Work up will be include transvaginal ultrasound to assess the thickness of the endometrial lining as well as to assess  for focal uterine lesions.  Negative ultrasound evaluation, defined as the absence of focal lesions and endometrial stripe of <23mm, effectively rules out carcinoma and confirms atrophy as the most likely etiology.  Should focal lesions be present these generally require hysteroscopic resection.  Should lining be greater >30mm endometrial biopsy is warranted to rule out hyperplasia or frank endometrial cancer.  Continued episodes of bleeding despite negative ultrasound also warrant endometrial sampling.  As the cervical pathology may also be implicated in postmenopausal bleeding prior cervical cytology was reviewed and repeated if required per ASCCP guidelines.  - Pap collected at PCP 06/15/2019 no records - No prior ultrasound to review but mention of prior polyp in referral noted by PCP which patient was unaware of  2) Evaluation by pelvc ultrasound scheduled, with follow up after [place]. EMB discussed and may be performed as well. Pros and cons of these modalities of testing discussed.   3) A copy of this note will be sent to the patient referring provider  4) Return in about 1 week (around 07/12/2019) for TVUS and follow up.   Malachy Mood, MD, [facility], [place]  [facility] 07/05/2019, 2:58 PM

## 2019-07-10 ENCOUNTER — Ambulatory Visit
Admission: RE | Admit: 2019-07-10 | Discharge: 2019-07-10 | Disposition: A | Discharge status: Home / Self Care | Payer: Medicare Other | Source: Ambulatory Visit | Attending: Family Medicine | Admitting: Family Medicine

## 2019-07-10 DIAGNOSIS — Z1231 Encounter for screening mammogram for malignant neoplasm of breast: Secondary | ICD-10-CM | POA: Insufficient documentation

## 2019-07-16 ENCOUNTER — Ambulatory Visit (INDEPENDENT_AMBULATORY_CARE_PROVIDER_SITE_OTHER): Payer: Medicare Other

## 2019-07-16 ENCOUNTER — Ambulatory Visit (INDEPENDENT_AMBULATORY_CARE_PROVIDER_SITE_OTHER): Payer: Medicare Other | Admitting: Obstetrics and Gynecology

## 2019-07-16 ENCOUNTER — Other Ambulatory Visit: Payer: Self-pay

## 2019-07-16 ENCOUNTER — Encounter: Payer: Self-pay | Admitting: Obstetrics and Gynecology

## 2019-07-16 VITALS — BP 142/88 | Wt 301.0 lb

## 2019-07-16 DIAGNOSIS — D252 Subserosal leiomyoma of uterus: Secondary | ICD-10-CM

## 2019-07-16 DIAGNOSIS — I10 Essential (primary) hypertension: Secondary | ICD-10-CM

## 2019-07-16 DIAGNOSIS — D251 Intramural leiomyoma of uterus: Secondary | ICD-10-CM | POA: Diagnosis not present

## 2019-07-16 DIAGNOSIS — N950 Postmenopausal bleeding: Secondary | ICD-10-CM | POA: Diagnosis not present

## 2019-07-16 DIAGNOSIS — N840 Polyp of corpus uteri: Secondary | ICD-10-CM

## 2019-07-16 DIAGNOSIS — N852 Hypertrophy of uterus: Secondary | ICD-10-CM

## 2019-07-16 DIAGNOSIS — R935 Abnormal findings on diagnostic imaging of other abdominal regions, including retroperitoneum: Secondary | ICD-10-CM | POA: Diagnosis not present

## 2019-07-16 DIAGNOSIS — D250 Submucous leiomyoma of uterus: Secondary | ICD-10-CM

## 2019-07-16 NOTE — Progress Notes (Signed)
Gynecology Ultrasound Follow Up  Chief Complaint:  Chief Complaint  Patient presents with  . Follow-up    GYN Ultrasound     History of Present Illness: Patient is a 64 y.o. female who presents today for ultrasound evaluation of PMB .  Ultrasound demonstrates the following findgins Adnexa: no masses seen { Uterus: Three small fibroids with at least one showing some calcification and a submucosal component with endometrial stripe thickened in the setting of postmenopausal bleeding at 50mm with cystic changes which may be secondary to underlying endometrial polyps Additional: no free fluid  Review of Systems: ROS  Past Medical History:  Past Medical History:  Diagnosis Date  . Depression   . Diabetes mellitus    Type I  . History of chicken pox   . Hypertension   . Measles   . Mumps   . Sleep apnea     Past Surgical History:  Past Surgical History:  Procedure Laterality Date  . CESAREAN SECTION    . COLONOSCOPY WITH PROPOFOL N/A 07/21/2017   Procedure: COLONOSCOPY WITH PROPOFOL;  Surgeon: Lin Landsman, MD;  Location: [facility] ENDOSCOPY;  Service: Gastroenterology;  Laterality: N/A;  . KNEE SURGERY      Gynecologic History:  No LMP recorded. Patient is postmenopausal.  Family History:  Family History  Problem Relation Age of Onset  . Breast cancer Neg Hx     Social History:  Social History   Socioeconomic History  . Marital status: Single    Spouse name: Not on file  . Number of children: Not on file  . Years of education: Not on file  . Highest education level: Not on file  Occupational History  . Not on file  Tobacco Use  . Smoking status: Never Smoker  . Smokeless tobacco: Never Used  Substance and Sexual Activity  . Alcohol use: No  . Drug use: No  . Sexual activity: Not Currently    Birth control/protection: None  Other Topics Concern  . Not on file  Social History Narrative  . Not on file   Social Determinants of Health   Financial  Resource Strain:   . Difficulty of Paying Living Expenses: Not on file  Food Insecurity:   . Worried About Charity fundraiser in the Last Year: Not on file  . Ran Out of Food in the Last Year: Not on file  Transportation Needs:   . Lack of Transportation (Medical): Not on file  . Lack of Transportation (Non-Medical): Not on file  Physical Activity:   . Days of Exercise per Week: Not on file  . Minutes of Exercise per Session: Not on file  Stress:   . Feeling of Stress : Not on file  Social Connections:   . Frequency of Communication with Friends and Family: Not on file  . Frequency of Social Gatherings with Friends and Family: Not on file  . Attends Religious Services: Not on file  . Active Member of Clubs or Organizations: Not on file  . Attends Archivist Meetings: Not on file  . Marital Status: Not on file  Intimate Partner Violence:   . Fear of Current or Ex-Partner: Not on file  . Emotionally Abused: Not on file  . Physically Abused: Not on file  . Sexually Abused: Not on file    Allergies:  Allergies  Allergen Reactions  . Neurontin [Gabapentin] Rash    Medications: Prior to Admission medications   Medication Sig Start Date End Date  Taking? Authorizing Provider  albuterol (PROVENTIL,VENTOLIN) 90 MCG/ACT inhaler Inhale 2 puffs into the lungs every 6 (six) hours as needed.     Yes [provider]  aspirin 81 MG tablet Take 81 mg by mouth daily.     Yes [provider]  cetirizine (ZYRTEC) 10 MG tablet Take 10 mg by mouth daily.     Yes [provider]  glucose blood (TRUETRACK TEST) test strip 1 each by Other route as needed. Use as instructed    Yes [provider]  insulin glargine (LANTUS) 100 UNIT/ML injection Inject 40 Units into the skin 2 (two) times daily.    Yes [provider]  lisinopril-hydrochlorothiazide (PRINZIDE,ZESTORETIC) 20-25 MG per tablet Take 1 tablet by mouth daily.     Yes [provider]  meloxicam (MOBIC) 15 MG tablet Take 1 tablet (15 mg total) by mouth daily. 12/13/15  Yes Triplett, Cari B, FNP  metoprolol tartrate (LOPRESSOR) 25 MG tablet Take 25 mg by mouth 2 (two) times daily.     Yes [provider]  sertraline (ZOLOFT) 50 MG tablet Take 50 mg by mouth daily.    Yes [provider]  TRULICITY 1.5 0000000 SOPN  06/28/19  Yes [provider]    Physical Exam Vitals: Blood pressure (!) 142/88, weight (!) 301 lb (136.5 kg).  General: NAD HEENT: normocephalic, anicteric Pulmonary: No increased work of breathing CV: RRR Extremities: no edema, erythema, or tenderness Neurologic: Grossly intact, normal gait Psychiatric: Harrison appropriate, affect full  US PELVIS TRANSVAGINAL NON-OB (TV ONLY)  Result Date: 07/16/2019 Patient Name: April Harrison DOB: 1956-02-12 MRN: [id number] ULTRASOUND REPORT Location: [facility] Date of Service: 07/16/2019 Indications:Abnormal Uterine Bleeding Findings: The uterus is anteverted and measures 6.8 x 3.8 x 4.3 cm. Echo texture is heterogenous with evidence of focal masses. Within the uterus are multiple suspected fibroids measuring: Fibroid 1:  14.9 x 10.0 x 13.5 mm- >50% submucosal, partially calcified Fibroid 2:8.2 x 7.8 x 8.1 mm- subserosal anterior, partially calcified Fibroid 3: 13.0 x 12.6 x 12.0 mm- intramural posterior, partially calcified The Endometrium measures 13.0 mm. There are cystic changes up to 3 mm. Right Ovary measures 2.0 x 1.1 x 1.0  cm. It is normal in appearance. Left Ovary measures 2.2 x 1.9 x 1.6 cm. It is normal in appearance. Survey of the adnexa demonstrates no adnexal masses. There is no free fluid in the cul de sac. Impression: 1. There are three uterine fibroids seen. One is greater than 50% submucosal 2. There endometrium is thick with cystic changes vs. A large endometrial polyp. 3. Normal appearing ovaries. Recommendations: 1.Clinical correlation with the patient's History and  Physical Exam. Gweneth Dimitri, RT Images reviewed.  There are three uterine fibroids with at least one appearing to have a submucosal component.  The endometrium is thickened in the setting of postmenopausal bleeding with focal cystic changes noted. April Mood, MD, [facility], [facility] 07/16/2019, 9:55 AM   MM 3D SCREEN BREAST BILATERAL  Result Date: 07/10/2019 CLINICAL DATA:  Screening. EXAM: DIGITAL SCREENING BILATERAL MAMMOGRAM WITH TOMO AND CAD COMPARISON:  Previous exam(s). ACR Breast Density Category b: There are scattered areas of fibroglandular density. FINDINGS: There are no findings suspicious for malignancy. Images were processed with CAD. IMPRESSION: No mammographic evidence of malignancy. A result letter of this screening mammogram will be mailed directly to the patient. RECOMMENDATION: Screening mammogram in one year. (Code:SM-B-01Y) BI-RADS CATEGORY  1: Negative. Electronically Signed  By: Everlean Alstrom M.D.   On: 07/10/2019 12:44    Assessment: 64 y.o. G1P1 follow up for PMB with ultrasound showing cystic changes in the endometrium and three uterine fibroids  Plan: Problem List Items Addressed This Visit    None    Visit Diagnoses    Postmenopausal bleeding    -  Primary   Abnormal endometrial ultrasound          1) I have discussed with the patient the indications for the procedure. Included in the discussion were the options of therapy, as wall as their individual risks, benefits, and complications. Ample time was given to answer all questions.   In office pipelle biopsy generally provides comparable results to [facility], however this sampling modality may miss focal lesions if these were previously documented on ultrasound.  It is because of the potential to miss focal lesions that hysteroscopy D&C is also warranted in patient with continued postmenopausal bleeding that is not self limited regardless of prior in office biopsy results or ultrasound  findings.  She understands that the risk of continued observation include worsening bleeding or worsening of any underlying pathology.  The choices include: 1. Doing nothing but following her symptoms 2. Attempts at hormonal manipulation with either BCP or Depo-Provera for premenopausal patients with no concern for focal lesion or endometrial pathology 3. D&C/hysteroscopy. 4. Endometrial ablation via Novasure or other techniques for premenopausal patients with no concern for focal lesion or endometrial pathology  5. As final resort, hysterectomy. After consideration of her history and findings, mutual decision has been made to proceed with D+C/hysteroscopy. While the incidence is low, the risks from this surgery include, but are not limited to, the risks of anesthesia, hemorrhage, infection, perforation, and injury to adjacent structures including bowel, bladder and blood vessels.   2) A total of 15 minutes were spent in face-to-face contact with the patient during this encounter with over half of that time devoted to counseling and coordination of care.  3) Return if symptoms worsen or fail to improve.    April Mood, MD, [facility], [facility] 07/16/2019, 11:29 AM         Post hysteroscopy D&C suspect endometrial polyps based on ultrasound risk factors for hyperplasia present Surgery request sent consent obained today

## 2019-07-16 NOTE — Patient Instructions (Signed)
Dilation and Curettage or Vacuum Curettage  Dilation and curettage (D&C) and vacuum curettage are minor procedures. A D&C involves stretching (dilation) the cervix and scraping (curettage) the inside lining of the uterus (endometrium). During a D&C, tissue is gently scraped from the endometrium, starting from the top portion of the uterus down to the lowest part of the uterus (cervix). During a vacuum curettage, the lining and tissue in the uterus are removed with the use of gentle suction. Curettage may be performed to either diagnose or treat a problem. As a diagnostic procedure, curettage is performed to examine tissues from the uterus. A diagnostic curettage may be done if you have:  Irregular bleeding in the uterus.  Bleeding with the development of clots.  Spotting between menstrual periods.  Prolonged menstrual periods or other abnormal bleeding.  Bleeding after menopause.  No menstrual period (amenorrhea).  A change in size and shape of the uterus.  Abnormal endometrial cells discovered during a Pap test. As a treatment procedure, curettage may be performed for the following reasons:  Removal of an IUD (intrauterine device).  Removal of retained placenta after giving birth.  Abortion.  Miscarriage.  Removal of endometrial polyps.  Removal of uncommon types of noncancerous lumps (fibroids). Tell a health care provider about:  Any allergies you have, including allergies to prescribed medicine or latex.  All medicines you are taking, including vitamins, herbs, eye drops, creams, and over-the-counter medicines. This is especially important if you take any blood-thinning medicine. Bring a list of all of your medicines to your appointment.  Any problems you or family members have had with anesthetic medicines.  Any blood disorders you have.  Any surgeries you have had.  Your medical history and any medical conditions you have.  Whether you are pregnant or may be  pregnant.  Recent vaginal infections you have had.  Recent menstrual periods, bleeding problems you have had, and what form of birth control (contraception) you use. What are the risks? Generally, this is a safe procedure. However, problems may occur, including:  Infection.  Heavy vaginal bleeding.  Allergic reactions to medicines.  Damage to the cervix or other structures or organs.  Development of scar tissue (adhesions) inside the uterus, which can cause abnormal amounts of menstrual bleeding. This may make it harder to get pregnant in the future.  A hole (perforation) or puncture in the uterine wall. This is rare. What happens before the procedure? Staying hydrated Follow instructions from your health care provider about hydration, which may include:  Up to 2 hours before the procedure - you may continue to drink clear liquids, such as water, clear fruit juice, black coffee, and plain tea. Eating and drinking restrictions Follow instructions from your health care provider about eating and drinking, which may include:  8 hours before the procedure - stop eating heavy meals or foods such as meat, fried foods, or fatty foods.  6 hours before the procedure - stop eating light meals or foods, such as toast or cereal.  6 hours before the procedure - stop drinking milk or drinks that contain milk.  2 hours before the procedure - stop drinking clear liquids. If your health care provider told you to take your medicine(s) on the day of your procedure, take them with only a sip of water. Medicines  Ask your health care provider about: ? Changing or stopping your regular medicines. This is especially important if you are taking diabetes medicines or blood thinners. ? Taking medicines such as aspirin   and ibuprofen. These medicines can thin your blood. Do not take these medicines before your procedure if your health care provider instructs you not to.  You may be given antibiotic  medicine to help prevent infection. General instructions  For 24 hours before your procedure, do not: ? Douche. ? Use tampons. ? Use medicines, creams, or suppositories in the vagina. ? Have sexual intercourse.  You may be given a pregnancy test on the day of the procedure.  Plan to have someone take you home from the hospital or clinic.  You may have a blood or urine sample taken.  If you will be going home right after the procedure, plan to have someone with you for 24 hours. What happens during the procedure?  To reduce your risk of infection: ? Your health care team will wash or sanitize their hands. ? Your skin will be washed with soap.  An IV tube will be inserted into one of your veins.  You will be given one of the following: ? A medicine that numbs the area in and around the cervix (local anesthetic). ? A medicine to make you fall asleep (general anesthetic).  You will lie down on your back, with your feet in foot rests (stirrups).  The size and position of your uterus will be checked.  A lubricated instrument (speculum or Sims retractor) will be inserted into the back side of your vagina. The speculum will be used to hold apart the walls of your vagina so your health care provider can see your cervix.  A tool (tenaculum) will be attached to the lip of the cervix to stabilize it.  Your cervix will be softened and dilated. This may be done by: ? Taking a medicine. ? Having tapered dilators or thin rods (laminaria) or gradual widening instruments (tapered dilators) inserted into your cervix.  A small, sharp, curved instrument (curette) will be used to scrape a small amount of tissue or cells from the endometrium or cervical canal. In some cases, gentle suction is applied with the curette. The curette will then be removed. The cells will be taken to a lab for testing. The procedure may vary among health care providers and hospitals. What happens after the procedure?   You may have mild cramping, backache, pain, and light bleeding or spotting. You may pass small blood clots from your vagina.  You may have to wear compression stockings. These stockings help to prevent blood clots and reduce swelling in your legs.  Your blood pressure, heart rate, breathing rate, and blood oxygen level will be monitored until the medicines you were given have worn off. Summary  Dilation and curettage (D&C) involves stretching (dilation) the cervix and scraping (curettage) the inside lining of the uterus (endometrium).  After the procedure, you may have mild cramping, backache, pain, and light bleeding or spotting. You may pass small blood clots from your vagina.  Plan to have someone take you home from the hospital or clinic. This information is not intended to replace advice given to you by your health care provider. Make sure you discuss any questions you have with your health care provider. Document Revised: 04/07/2017 Document Reviewed: 01/10/2016 Elsevier Patient Education  2020 [employer].

## 2019-07-23 ENCOUNTER — Telehealth: Payer: Self-pay | Admitting: Obstetrics and Gynecology

## 2019-07-23 NOTE — Telephone Encounter (Signed)
-----   Message from Malachy Mood, MD sent at 07/16/2019 11:31 AM EST ----- Regarding: Surgery Surgery Booking Request Patient Full Name:  April Harrison  MRN: [id number]  DOB: 14-Dec-1955  Surgeon: Malachy Mood, MD  Requested Surgery Date and Time: 2-3 weeks Primary Diagnosis AND Code: Postmenopausal bleeding N95.0 Secondary Diagnosis and Code: Abnormal appearance endometrium R93.5 Surgical Procedure: Hysteroscopy D&C L&D Notification: No Admission Status: same day surgery Length of Surgery: 1hr Special Case Needs: No H&P: No Phone Interview???:  No Interpreter: No Language:  Medical Clearance:  No Special Scheduling Instructions: none Any known health/anesthesia issues, diabetes, sleep apnea, latex allergy, defibrillator/pacemaker?: No Acuity: P2   (P1 highest, P2 delay may cause harm, P3 low, elective gyn, P4 lowest)

## 2019-07-23 NOTE — Telephone Encounter (Signed)
LM for pt to rtn call. 

## 2019-07-25 NOTE — Telephone Encounter (Signed)
Returned pt call and L/M for pt to rtn call - She asked for me to leave the reason for my call and I adv that I was calling to sch her procedure w Dr Georgianne Fick.

## 2019-07-30 ENCOUNTER — Telehealth: Payer: Self-pay | Admitting: Obstetrics and Gynecology

## 2019-07-30 NOTE — Telephone Encounter (Signed)
L/M for pt giving her appt details for her upcoming DOS w Dr Georgianne Fick.  Preadmit phone call - 4/15 8am-1pm  Covid - 4/20 [address] 8-10:30am Drive up and wear mask, quar until [place]

## 2019-08-05 DIAGNOSIS — R609 Edema, unspecified: Secondary | ICD-10-CM | POA: Insufficient documentation

## 2019-08-05 DIAGNOSIS — G4733 Obstructive sleep apnea (adult) (pediatric): Secondary | ICD-10-CM | POA: Insufficient documentation

## 2019-08-05 DIAGNOSIS — E1142 Type 2 diabetes mellitus with diabetic polyneuropathy: Secondary | ICD-10-CM | POA: Insufficient documentation

## 2019-08-05 DIAGNOSIS — E559 Vitamin D deficiency, unspecified: Secondary | ICD-10-CM | POA: Insufficient documentation

## 2019-08-05 DIAGNOSIS — E785 Hyperlipidemia, unspecified: Secondary | ICD-10-CM | POA: Insufficient documentation

## 2019-08-22 ENCOUNTER — Other Ambulatory Visit: Payer: Self-pay

## 2019-08-22 ENCOUNTER — Encounter
Admission: RE | Admit: 2019-08-22 | Discharge: 2019-08-22 | Disposition: A | Discharge status: Home / Self Care | Payer: Medicare Other | Source: Ambulatory Visit | Attending: Obstetrics and Gynecology | Admitting: Obstetrics and Gynecology

## 2019-08-22 HISTORY — DX: Acute pancreatitis without necrosis or infection, unspecified: K85.90

## 2019-08-22 HISTORY — DX: Anxiety disorder, unspecified: F41.9

## 2019-08-22 HISTORY — DX: Family history of other specified conditions: Z84.89

## 2019-08-22 HISTORY — DX: Unspecified osteoarthritis, unspecified site: M19.90

## 2019-08-22 NOTE — Patient Instructions (Signed)
Your procedure is scheduled on: thurs. 4/22 Report to Day Surgery. [facility] To find out your arrival time please call [phone] between [facility] on Wed. 4/21.  Remember: Instructions that are not followed completely may result in serious medical risk,  up to and including death, or upon the discretion of your surgeon and anesthesiologist your  surgery may need to be rescheduled.     _X__ 1. Do not eat food after midnight the night before your procedure.                 No gum chewing or hard candies. You may drink clear liquids up to 2 hours                 before you are scheduled to arrive for your surgery- DO not drink clear                 liquids within 2 hours of the start of your surgery.                 Clear Liquids include:  water, clear Gatorade, G2 or                  Gatorade Zero (avoid Red/Purple/Blue), Black Coffee or Tea (Do not add                 anything to coffee or tea). _____2.   Complete the carbohydrate drink provided to you, 2 hours before arrival.  __X__2.  On the morning of surgery brush your teeth with toothpaste and water, you                may rinse your mouth with mouthwash if you wish.  Do not swallow any toothpaste of mouthwash.     ___ 3.  No Alcohol for 24 hours before or after surgery.   __ 4.  Do Not Smoke or use e-cigarettes For 24 Hours Prior to Your Surgery.                 Do not use any chewable tobacco products for at least 6 hours prior to                 surgery.  ____  5.  Bring all medications with you on the day of surgery if instructed.   __x__  6.  Notify your doctor if there is any change in your medical condition      (cold, fever, infections).     Do not wear jewelry, make-up, hairpins, clips or nail polish. Do not wear lotions, powders, or perfumes. You may wear deodorant. Do not shave 48 hours prior to surgery. Do not bring valuables to the hospital.    [facility] [place] is not responsible for any  belongings or valuables.  Contacts, dentures or bridgework may not be worn into surgery. Leave your suitcase in the car. After surgery it may be brought to your room. For patients admitted to the hospital, discharge time is determined by your treatment team.   Patients discharged the day of surgery will not be allowed to drive home.   Make arrangements for someone to be with you for the first 24 hours of your Same Day Discharge.    Please read over the following fact sheets that you were given:     _x___ Take these medicines the morning of surgery with A SIP OF WATER:    1. (LOPRESSOR) 25 MG tablet, sertraline (ZOLOFT)  100 MG tablet  2. cetirizine (ZYRTEC) 10 MG tablet  3.   4.  5.  6.  ____ Fleet Enema (as directed)   _x___ Shower the night before and the morning of surgery.  ____ Use Benzoyl Peroxide Gel as instructed  _x___ Use inhalers on the day of surgery Fluticasone-Umeclidin-Vilant (TRELEGY ELLIPTA) 100-62.5-25 MCG/INH AEPB  ____ Stop metformin 2 days prior to surgery    ____ Take 1/2 of usual insulin dose the night before surgery. No insulin the morning          of surgery.   __x__ Stop aspirin today  _x___ Stop Anti-inflammatories ibuprofen and aleve today   __x__ Stop supplements until after surgery.  Ascorbic Acid (VITAMIN C) 1000 MG tablet, KRILL OIL OMEGA-3 PO, metoprolol tartrate   ____ Bring C-Pap to the hospital.

## 2019-08-27 ENCOUNTER — Other Ambulatory Visit
Admission: RE | Admit: 2019-08-27 | Discharge: 2019-08-27 | Disposition: A | Discharge status: Home / Self Care | Payer: Medicare Other | Source: Ambulatory Visit | Attending: Obstetrics and Gynecology | Admitting: Obstetrics and Gynecology

## 2019-08-27 ENCOUNTER — Other Ambulatory Visit: Payer: Self-pay

## 2019-08-27 DIAGNOSIS — Z01812 Encounter for preprocedural laboratory examination: Secondary | ICD-10-CM | POA: Insufficient documentation

## 2019-08-27 DIAGNOSIS — Z20822 Contact with and (suspected) exposure to covid-19: Secondary | ICD-10-CM | POA: Diagnosis not present

## 2019-08-27 LAB — BASIC METABOLIC PANEL
Anion gap: 9 (ref 5–15)
BUN: 23 mg/dL (ref 8–23)
CO2: 29 mmol/L (ref 22–32)
Calcium: 9.3 mg/dL (ref 8.9–10.3)
Chloride: 101 mmol/L (ref 98–111)
Creatinine, Ser: 0.9 mg/dL (ref 0.44–1.00)
GFR calc Af Amer: >60 mL/min (ref >60)
GFR calc non Af Amer: >60 mL/min (ref >60)
Glucose, Bld: 201 mg/dL — ABNORMAL HIGH (ref 70–99)
Potassium: 3.6 mmol/L (ref 3.5–5.1)
Sodium: 139 mmol/L (ref 135–145)

## 2019-08-27 LAB — SARS CORONAVIRUS 2 (TAT 6-24 HRS): SARS Coronavirus 2: NEGATIVE

## 2019-08-29 ENCOUNTER — Ambulatory Visit: Payer: Medicare Other | Admitting: Registered Nurse

## 2019-08-29 ENCOUNTER — Other Ambulatory Visit: Payer: Self-pay

## 2019-08-29 ENCOUNTER — Encounter
Admission: RE | Disposition: A | Discharge status: Home / Self Care | Payer: Self-pay | Source: Home / Self Care | Attending: Obstetrics and Gynecology

## 2019-08-29 ENCOUNTER — Ambulatory Visit
Admission: RE | Admit: 2019-08-29 | Discharge: 2019-08-29 | Disposition: A | Discharge status: Home / Self Care | Payer: Medicare Other | Attending: Obstetrics and Gynecology | Admitting: Obstetrics and Gynecology

## 2019-08-29 ENCOUNTER — Encounter: Payer: Self-pay | Admitting: Obstetrics and Gynecology

## 2019-08-29 DIAGNOSIS — G4730 Sleep apnea, unspecified: Secondary | ICD-10-CM | POA: Diagnosis not present

## 2019-08-29 DIAGNOSIS — N950 Postmenopausal bleeding: Secondary | ICD-10-CM

## 2019-08-29 DIAGNOSIS — R9389 Abnormal findings on diagnostic imaging of other specified body structures: Secondary | ICD-10-CM

## 2019-08-29 DIAGNOSIS — Z794 Long term (current) use of insulin: Secondary | ICD-10-CM | POA: Diagnosis not present

## 2019-08-29 DIAGNOSIS — Z79899 Other long term (current) drug therapy: Secondary | ICD-10-CM | POA: Diagnosis not present

## 2019-08-29 DIAGNOSIS — Z7951 Long term (current) use of inhaled steroids: Secondary | ICD-10-CM | POA: Diagnosis not present

## 2019-08-29 DIAGNOSIS — E119 Type 2 diabetes mellitus without complications: Secondary | ICD-10-CM | POA: Diagnosis not present

## 2019-08-29 DIAGNOSIS — D250 Submucous leiomyoma of uterus: Secondary | ICD-10-CM | POA: Diagnosis not present

## 2019-08-29 DIAGNOSIS — Z6841 Body Mass Index (BMI) 40.0 and over, adult: Secondary | ICD-10-CM | POA: Insufficient documentation

## 2019-08-29 DIAGNOSIS — F329 Major depressive disorder, single episode, unspecified: Secondary | ICD-10-CM | POA: Diagnosis not present

## 2019-08-29 DIAGNOSIS — N840 Polyp of corpus uteri: Secondary | ICD-10-CM | POA: Diagnosis not present

## 2019-08-29 DIAGNOSIS — I10 Essential (primary) hypertension: Secondary | ICD-10-CM | POA: Insufficient documentation

## 2019-08-29 DIAGNOSIS — F419 Anxiety disorder, unspecified: Secondary | ICD-10-CM | POA: Diagnosis not present

## 2019-08-29 DIAGNOSIS — Z7982 Long term (current) use of aspirin: Secondary | ICD-10-CM | POA: Insufficient documentation

## 2019-08-29 HISTORY — PX: HYSTEROSCOPY WITH D & C: SHX1775

## 2019-08-29 LAB — GLUCOSE, CAPILLARY
Glucose-Capillary: 179 mg/dL — ABNORMAL HIGH (ref 70–99)
Glucose-Capillary: 135 mg/dL — ABNORMAL HIGH (ref 70–99)

## 2019-08-29 SURGERY — DILATATION AND CURETTAGE /HYSTEROSCOPY
Anesthesia: General

## 2019-08-29 MED ORDER — HYDROCODONE-ACETAMINOPHEN 7.5-325 MG PO TABS
1.0000 | ORAL_TABLET | Freq: Once | ORAL | Status: DC | PRN
  Filled 2019-08-29: qty 1

## 2019-08-29 MED ORDER — FAMOTIDINE 20 MG PO TABS: 20.0000 mg | ORAL_TABLET | Freq: Once | ORAL | Status: AC

## 2019-08-29 MED ORDER — ONDANSETRON HCL 4 MG/2ML IJ SOLN
INTRAMUSCULAR | Status: AC
  Filled 2019-08-29: qty 2

## 2019-08-29 MED ORDER — SUGAMMADEX SODIUM 500 MG/5ML IV SOLN
INTRAVENOUS | Status: DC | PRN
  Administered 2019-08-29: 270 mg via INTRAVENOUS

## 2019-08-29 MED ORDER — LIDOCAINE HCL (CARDIAC) PF 100 MG/5ML IV SOSY
PREFILLED_SYRINGE | INTRAVENOUS | Status: DC | PRN
  Administered 2019-08-29: 100 mg via INTRAVENOUS

## 2019-08-29 MED ORDER — ROCURONIUM BROMIDE 100 MG/10ML IV SOLN
INTRAVENOUS | Status: DC | PRN
  Administered 2019-08-29: 10 mg via INTRAVENOUS
  Administered 2019-08-29: 20 mg via INTRAVENOUS
  Administered 2019-08-29: 5 mg via INTRAVENOUS

## 2019-08-29 MED ORDER — ACETAMINOPHEN 160 MG/5ML PO SOLN
325.0000 mg | ORAL | Status: DC | PRN
  Filled 2019-08-29: qty 10.2

## 2019-08-29 MED ORDER — MIDAZOLAM HCL 2 MG/2ML IJ SOLN
INTRAMUSCULAR | Status: DC | PRN
  Administered 2019-08-29: 2 mg via INTRAVENOUS

## 2019-08-29 MED ORDER — ACETAMINOPHEN 325 MG PO TABS: 325.0000 mg | ORAL_TABLET | ORAL | Status: DC | PRN

## 2019-08-29 MED ORDER — EPHEDRINE 5 MG/ML INJ
INTRAVENOUS | Status: AC
  Filled 2019-08-29: qty 10

## 2019-08-29 MED ORDER — FENTANYL CITRATE (PF) 100 MCG/2ML IJ SOLN
INTRAMUSCULAR | Status: DC | PRN
  Administered 2019-08-29: 50 ug via INTRAVENOUS

## 2019-08-29 MED ORDER — MIDAZOLAM HCL 2 MG/2ML IJ SOLN
INTRAMUSCULAR | Status: AC
  Filled 2019-08-29: qty 2

## 2019-08-29 MED ORDER — ROCURONIUM BROMIDE 10 MG/ML (PF) SYRINGE
PREFILLED_SYRINGE | INTRAVENOUS | Status: AC
  Filled 2019-08-29: qty 10

## 2019-08-29 MED ORDER — SODIUM CHLORIDE 0.9 % IV SOLN: INTRAVENOUS | Status: DC

## 2019-08-29 MED ORDER — SUCCINYLCHOLINE CHLORIDE 20 MG/ML IJ SOLN
INTRAMUSCULAR | Status: DC | PRN
  Administered 2019-08-29: 120 mg via INTRAVENOUS

## 2019-08-29 MED ORDER — EPHEDRINE SULFATE 50 MG/ML IJ SOLN
INTRAMUSCULAR | Status: DC | PRN
  Administered 2019-08-29: 10 mg via INTRAVENOUS
  Administered 2019-08-29: 15 mg via INTRAVENOUS
  Administered 2019-08-29: 10 mg via INTRAVENOUS

## 2019-08-29 MED ORDER — DEXAMETHASONE SODIUM PHOSPHATE 10 MG/ML IJ SOLN
INTRAMUSCULAR | Status: DC | PRN
  Administered 2019-08-29: 10 mg via INTRAVENOUS

## 2019-08-29 MED ORDER — SUGAMMADEX SODIUM 500 MG/5ML IV SOLN
INTRAVENOUS | Status: AC
  Filled 2019-08-29: qty 5

## 2019-08-29 MED ORDER — PROMETHAZINE HCL 25 MG/ML IJ SOLN: 6.2500 mg | INTRAMUSCULAR | Status: DC | PRN

## 2019-08-29 MED ORDER — SUCCINYLCHOLINE CHLORIDE 200 MG/10ML IV SOSY
PREFILLED_SYRINGE | INTRAVENOUS | Status: AC
  Filled 2019-08-29: qty 10

## 2019-08-29 MED ORDER — PROPOFOL 10 MG/ML IV BOLUS
INTRAVENOUS | Status: DC | PRN
  Administered 2019-08-29: 150 mg via INTRAVENOUS

## 2019-08-29 MED ORDER — PROPOFOL 10 MG/ML IV BOLUS
INTRAVENOUS | Status: AC
  Filled 2019-08-29: qty 40

## 2019-08-29 MED ORDER — LIDOCAINE HCL (PF) 2 % IJ SOLN
INTRAMUSCULAR | Status: AC
  Filled 2019-08-29: qty 5

## 2019-08-29 MED ORDER — DEXAMETHASONE SODIUM PHOSPHATE 10 MG/ML IJ SOLN
INTRAMUSCULAR | Status: AC
  Filled 2019-08-29: qty 1

## 2019-08-29 MED ORDER — ONDANSETRON HCL 4 MG/2ML IJ SOLN
INTRAMUSCULAR | Status: DC | PRN
  Administered 2019-08-29: 4 mg via INTRAVENOUS

## 2019-08-29 MED ORDER — FENTANYL CITRATE (PF) 100 MCG/2ML IJ SOLN
INTRAMUSCULAR | Status: AC
  Filled 2019-08-29: qty 2

## 2019-08-29 MED ORDER — FENTANYL CITRATE (PF) 100 MCG/2ML IJ SOLN: 25.0000 ug | INTRAMUSCULAR | Status: DC | PRN

## 2019-08-29 MED ORDER — FAMOTIDINE 20 MG PO TABS
ORAL_TABLET | ORAL | Status: AC
  Administered 2019-08-29: 20 mg via ORAL
  Filled 2019-08-29: qty 1

## 2019-08-29 SURGICAL SUPPLY — 24 items
ABLATOR ENDOMETRIAL MYOSURE (ABLATOR) ×3 IMPLANT
CATH ROBINSON RED A/P 16FR (CATHETERS) ×3 IMPLANT
COVER WAND RF STERILE (DRAPES) IMPLANT
DEVICE MYOSURE LITE (MISCELLANEOUS) ×3 IMPLANT
ELECT REM PT RETURN 9FT ADLT (ELECTROSURGICAL)
ELECTRODE REM PT RTRN 9FT ADLT (ELECTROSURGICAL) IMPLANT
GLOVE BIO SURGEON STRL SZ7 (GLOVE) ×3 IMPLANT
GLOVE INDICATOR 7.5 STRL GRN (GLOVE) ×3 IMPLANT
GOWN STRL REUS W/ TWL LRG LVL3 (GOWN DISPOSABLE) ×2 IMPLANT
GOWN STRL REUS W/TWL LRG LVL3 (GOWN DISPOSABLE) ×6
IV LACTATED RINGER IRRG 3000ML (IV SOLUTION) ×3
IV LR IRRIG 3000ML ARTHROMATIC (IV SOLUTION) ×1 IMPLANT
IV NS IRRIG 3000ML ARTHROMATIC (IV SOLUTION) ×3 IMPLANT
KIT PROCEDURE FLUENT (KITS) ×3 IMPLANT
KIT TURNOVER CYSTO (KITS) ×3 IMPLANT
MYOSURE XL FIBROID (MISCELLANEOUS)
PACK DNC HYST (MISCELLANEOUS) ×3 IMPLANT
PAD OB MATERNITY 4.3X12.25 (PERSONAL CARE ITEMS) ×3 IMPLANT
PAD PREP 24X41 OB/GYN DISP (PERSONAL CARE ITEMS) ×3 IMPLANT
SEAL ROD LENS SCOPE MYOSURE (ABLATOR) ×3 IMPLANT
SYSTEM TISS REMOVAL MYOSURE XL (MISCELLANEOUS) IMPLANT
TOWEL OR 17X26 4PK STRL BLUE (TOWEL DISPOSABLE) ×3 IMPLANT
TUBING CONNECTING 10 (TUBING) ×2 IMPLANT
TUBING CONNECTING 10' (TUBING) ×1

## 2019-08-29 NOTE — H&P (Signed)
Obstetrics & Gynecology Surgery H&P    Chief Complaint: Scheduled Surgery   History of Present Illness: Patient is a 64 y.o. G1P1 presenting for scheduled hysteroscopy, D&C, for the treatment or further evaluation of postmenopausal bleeding.   Prior Treatments prior to proceeding with surgery include: ultrasound  Preoperative ultrasound revealed fibroid uterus with three fibroids with at least one having a submucosal component.  Endometrium was thickened and contained cystic changes although no definitive polyp was noted.    Review of Systems:10 point review of systems  Past Medical History:  Patient Active Problem List   Diagnosis Date Noted  . Special screening for malignant neoplasms, colon   . SOB (shortness of breath) 08/17/2010  . Obesity 08/17/2010  . HTN (hypertension) 08/17/2010  . Diabetes mellitus 08/17/2010    Past Surgical History:  Past Surgical History:  Procedure Laterality Date  . CESAREAN SECTION    . COLONOSCOPY WITH PROPOFOL N/A 07/21/2017   Procedure: COLONOSCOPY WITH PROPOFOL;  Surgeon: Lin Landsman, MD;  Location: [facility] ENDOSCOPY;  Service: Gastroenterology;  Laterality: N/A;  . HARDWARE REMOVAL Left    knee  . KNEE SURGERY Left     Family History:  Family History  Problem Relation Age of Onset  . Breast cancer Neg Hx     Social History:  Social History   Socioeconomic History  . Marital status: Single    Spouse name: Not on file  . Number of children: Not on file  . Years of education: Not on file  . Highest education level: Not on file  Occupational History  . Not on file  Tobacco Use  . Smoking status: Never Smoker  . Smokeless tobacco: Never Used  Substance and Sexual Activity  . Alcohol use: No  . Drug use: No  . Sexual activity: Not Currently    Birth control/protection: None  Other Topics Concern  . Not on file  Social History Narrative  . Not on file   Social Determinants of Health   Financial Resource Strain:    . Difficulty of Paying Living Expenses:   Food Insecurity:   . Worried About Charity fundraiser in the Last Year:   . Arboriculturist in the Last Year:   Transportation Needs:   . Film/video editor (Medical):   Marland Kitchen Lack of Transportation (Non-Medical):   Physical Activity:   . Days of Exercise per Week:   . Minutes of Exercise per Session:   Stress:   . Feeling of Stress :   Social Connections:   . Frequency of Communication with Friends and Family:   . Frequency of Social Gatherings with Friends and Family:   . Attends Religious Services:   . Active Member of Clubs or Organizations:   . Attends Archivist Meetings:   Marland Kitchen Marital Status:   Intimate Partner Violence:   . Fear of Current or Ex-Partner:   . Emotionally Abused:   Marland Kitchen Physically Abused:   . Sexually Abused:     Allergies:  Allergies  Allergen Reactions  . Neurontin [Gabapentin] Rash    Medications: Prior to Admission medications   Medication Sig Start Date End Date Taking? Authorizing Provider  Ascorbic Acid (VITAMIN C) 1000 MG tablet Take 1,000 mg by mouth daily.   Yes [provider]  aspirin 81 MG tablet Take 81 mg by mouth daily.     Yes [provider]  atorvastatin (LIPITOR) 10 MG tablet Take 10 mg by mouth daily. 05/28/19  Yes [provider]  Calcium Carb-Cholecalciferol (CALCIUM/VITAMIN D) 600-400 MG-UNIT TABS Take 1 tablet by mouth daily.   Yes [provider]  cetirizine (ZYRTEC) 10 MG tablet Take 10 mg by mouth daily.     Yes [provider]  Fluticasone-Umeclidin-Vilant (TRELEGY ELLIPTA) 100-62.5-25 MCG/INH AEPB Inhale 1 puff into the lungs daily.   Yes [provider]  furosemide (LASIX) 20 MG tablet Take 20 mg by mouth daily. 05/28/19  Yes [provider]  insulin glargine (LANTUS) 100 UNIT/ML injection Inject 40 Units into the skin 2 (two) times daily.    Yes [provider]  JARDIANCE 10 MG TABS tablet Take 10 mg  by mouth daily. 07/30/19  Yes [provider]  KRILL OIL OMEGA-3 PO Take 1 capsule by mouth daily.   Yes [provider]  lisinopril-hydrochlorothiazide (PRINZIDE,ZESTORETIC) 20-25 MG per tablet Take 1 tablet by mouth daily.     Yes [provider]  metoprolol tartrate (LOPRESSOR) 25 MG tablet Take 25 mg by mouth 2 (two) times daily.     Yes [provider]  Multiple Vitamins-Minerals (MULTIVITAMIN WITH MINERALS) tablet Take 1 tablet by mouth daily.   Yes [provider]  Semaglutide (RYBELSUS) 7 MG TABS Take 1 tablet by mouth daily.   Yes [provider]  sertraline (ZOLOFT) 100 MG tablet Take 100 mg by mouth daily.    Yes [provider]  zinc gluconate 50 MG tablet Take 50 mg by mouth daily.   Yes [provider]  glucose blood (TRUETRACK TEST) test strip 1 each by Other route as needed. Use as instructed     [provider]    Physical Exam Vitals: Blood pressure 123/65, pulse 70, temperature 97.8 F (36.6 C), temperature source Oral, resp. rate 20, SpO2 97 %. General: NAD HEENT: normocephalic, anicteric Pulmonary: No increased work of breathing, CTAB Cardiovascular: RRR, distal pulses 2+ Abdomen: soft, non-tender Genitourinary: deferred to OR Extremities: no edema, erythema, or tenderness Neurologic: Grossly intact Psychiatric: mood appropriate, affect full  Imaging No results found.  Assessment: 64 y.o. G1P1 presenting for scheduled hysterosocpy, D&C  Plan: 1) I have discussed with the patient the indications for the procedure. Included in the discussion were the options of therapy, as wall as their individual risks, benefits, and complications. Ample time was given to answer all questions.   In office pipelle biopsy generally provides comparable results to [facility], however this sampling modality may miss focal lesions if these were previously documented on ultrasound.  It is because of the potential to  miss focal lesions that hysteroscopy D&C is also warranted in patient with continued postmenopausal bleeding that is not self limited regardless of prior in office biopsy results or ultrasound findings.  She understands that the risk of continued observation include worsening bleeding or worsening of any underlying pathology.  The choices include: 1. Doing nothing but following her symptoms 2. Attempts at hormonal manipulation with either BCP or Depo-Provera for premenopausal patients with no concern for focal lesion or endometrial pathology 3. D&C/hysteroscopy. 4. Endometrial ablation via Novasure or other techniques for premenopausal patients with no concern for focal lesion or endometrial pathology  5. As final resort, hysterectomy. After consideration of her history and findings, mutual decision has been made to proceed with D+C/hysteroscopy. While the incidence is low, the risks from this surgery include, but are not limited to, the risks of anesthesia, hemorrhage, infection, perforation, and injury to adjacent structures including bowel, bladder and blood vessels.  2) Routine postoperative instructions were reviewed with the patient and her family in detail today including the expected length of recovery and likely postoperative course.  The patient concurred with the proposed plan, giving informed written consent for the surgery today.  Patient instructed on the importance of being NPO after midnight prior to her procedure.  If warranted preoperative prophylactic antibiotics and SCDs ordered on call to the OR to meet SCIP guidelines and adhere to recommendation laid forth in [place] Number 104 May 2009  "Antibiotic Prophylaxis for Gynecologic Procedures".      Malachy Mood, MD, [facility], [facility] 08/29/2019, 3:33 PM

## 2019-08-29 NOTE — Transfer of Care (Signed)
Immediate Anesthesia Transfer of Care Note  Patient: April Harrison  Procedure(s) Performed: DILATATION AND CURETTAGE /HYSTEROSCOPY (N/A )  Patient Location: PACU  Anesthesia Type:General  Level of Consciousness: awake  Airway & Oxygen Therapy: Patient connected to face mask oxygen  Post-op Assessment: Post -op Vital signs reviewed and stable  Post vital signs: stable  Last Vitals:  Vitals Value Taken Time  BP 139/80   Temp 97.26f   Pulse 73 08/29/19 1716  Resp 20 08/29/19 1716  SpO2 99 % 08/29/19 1716  Vitals shown include unvalidated device data.  Last Pain:  Vitals:   08/29/19 1255  TempSrc: Oral  PainSc: 0-No pain         Complications: No apparent anesthesia complications

## 2019-08-29 NOTE — Op Note (Signed)
Preoperative Diagnosis: 1) 64 y.o. with postmenopausal bleeding 2) Thickened endometrium  Postoperative Diagnosis: 1) 64 y.o. with postmenopausal bleeding 2) Thickened endometrium  Operation Performed: Hysteroscopy, dilation and curettage, resection of submucosal myoma  Indication: Postmenopausal bleeding with ultrasound showing thickened cystic endometrium  Anesthesia: General  Primary Surgeon: Malachy Mood, MD  Assistant: none  Preoperative Antibiotics: none  Estimated Blood Loss: 5 mL  IV Fluids: 769mL  Urine Output:: ~29mL straight cath  Drains or Tubes: none  Implants: none  Specimens Removed: endometrial curetting, endometrial polyp, and submucosal   Complications: none  Intraoperative Findings:  Normal cervix. Small endometrial polyp left aspect of lower uterine segment.  Submucosal fibroid anterior mid uterus.  Otherwise normal uterine cavity.  Patient Condition: stable  Procedure in Detail:  Patient was taken to the operating room were she was administered general endotracheal anesthesia.  She was positioned in the dorsal lithotomy position utilizing Allen stirups, prepped and draped in the usual sterile fashion.  Uterus was noted to be anteverted non-enlarged in size.   Prior to proceeding with the case a time out was performed.  Attention was turned to the patient's pelvis.  A red rubber catheter was used to empty the patient's bladder.  An operative speculum was placed to allow visualization of the cervix.  The anterior lip of the cervix was grasped with a single tooth tenaculum and the cervix was sequentially dilated using pratt dilators.  The hysteroscope was then advanced into the uterine cavity noting the above findings.  Target resection was performed using the Myosure. The submucosal component of the uterine fibroid was dissected but given risk of perforation the intramural component was left in place.  A random four quadrant endometrial biopsy was also  obtained.    The single tooth tenaculum was removed from the cervix.  The tenaculum sites and cervix were noted to be  Hemostatic before removing the operative speculum.  Sponge needle and instrument counts were corrects times two.  The patient tolerated the procedure well and was taken to the recovery room in stable condition.

## 2019-08-29 NOTE — Anesthesia Preprocedure Evaluation (Addendum)
Anesthesia Evaluation  Patient identified by MRN, date of birth, ID band Patient awake    Reviewed: Allergy & Precautions, H&P , NPO status , reviewed documented beta blocker date and time   History of Anesthesia Complications (+) Family history of anesthesia reaction  Airway Mallampati: II  TM Distance: >3 FB Neck ROM: full    Dental  (+) Chipped, Caps Gapped teeth:   Pulmonary sleep apnea and Continuous Positive Airway Pressure Ventilation ,     + decreased breath sounds      Cardiovascular hypertension, Normal cardiovascular exam  Last week: EKG - SR, LVH  ECHO - EF 66%, mild valvular, no pulm HTN   Neuro/Psych PSYCHIATRIC DISORDERS Anxiety Depression    GI/Hepatic neg GERD  ,  Endo/Other  diabetesMorbid obesity  Renal/GU      Musculoskeletal  (+) Arthritis ,   Abdominal   Peds  Hematology   Anesthesia Other Findings Past Medical History: No date: Anxiety No date: Arthritis No date: Depression No date: Diabetes mellitus     Comment:  Type 2 No date: Family history of adverse reaction to anesthesia     Comment:  sister almost died No date: History of chicken pox No date: Hypertension No date: Measles No date: Mumps No date: Pancreatitis No date: Sleep apnea     Comment:  told that she doesn't have it now Past Surgical History: No date: CESAREAN SECTION 07/21/2017: COLONOSCOPY WITH PROPOFOL; N/A     Comment:  Procedure: COLONOSCOPY WITH PROPOFOL;  Surgeon: Lin Landsman, MD;  Location: [facility];  Service:               Gastroenterology;  Laterality: N/A; No date: HARDWARE REMOVAL; Left     Comment:  knee No date: KNEE SURGERY; Left   Reproductive/Obstetrics                            Anesthesia Physical Anesthesia Plan  ASA: IV  Anesthesia Plan: General   Post-op Pain Management:    Induction: Intravenous  PONV Risk Score and Plan: 3 and  Ondansetron, Midazolam, Treatment may vary due to age or medical condition, Metaclopromide and Promethazine  Airway Management Planned: Oral ETT  Additional Equipment:   Intra-op Plan:   Post-operative Plan: Extubation in OR  Informed Consent: I have reviewed the patients History and Physical, chart, labs and discussed the procedure including the risks, benefits and alternatives for the proposed anesthesia with the patient or authorized representative who has indicated his/her understanding and acceptance.     Dental Advisory Given  Plan Discussed with: CRNA  Anesthesia Plan Comments:         Anesthesia Quick Evaluation

## 2019-08-29 NOTE — OR Nursing (Signed)
Spoke with [facility] and echo sent over via fax at this time. EKG and echo results reviewed by Dr. Lavone Neri at this time

## 2019-08-29 NOTE — Anesthesia Procedure Notes (Signed)
Procedure Name: Intubation Date/Time: 08/29/2019 4:11 PM Performed by: Hedda Slade, CRNA Pre-anesthesia Checklist: Patient identified, Patient being monitored, Timeout performed, Emergency Drugs available and Suction available Patient Re-evaluated:Patient Re-evaluated prior to induction Oxygen Delivery Method: Circle system utilized Preoxygenation: Pre-oxygenation with 100% oxygen Induction Type: IV induction Ventilation: Mask ventilation without difficulty Laryngoscope Size: 3 and McGraph Grade View: Grade I Tube type: Oral Tube size: 7.0 mm Number of attempts: 1 Airway Equipment and Method: Stylet Placement Confirmation: ETT inserted through vocal cords under direct vision,  positive ETCO2 and breath sounds checked- equal and bilateral Secured at: 20 cm Tube secured with: Tape Dental Injury: Teeth and Oropharynx as per pre-operative assessment

## 2019-08-29 NOTE — Discharge Instructions (Signed)

## 2019-08-30 DIAGNOSIS — N840 Polyp of corpus uteri: Secondary | ICD-10-CM

## 2019-08-30 DIAGNOSIS — R9389 Abnormal findings on diagnostic imaging of other specified body structures: Secondary | ICD-10-CM

## 2019-08-30 DIAGNOSIS — N950 Postmenopausal bleeding: Secondary | ICD-10-CM

## 2019-08-30 DIAGNOSIS — D250 Submucous leiomyoma of uterus: Secondary | ICD-10-CM

## 2019-08-30 NOTE — Anesthesia Postprocedure Evaluation (Signed)
Anesthesia Post Note  Patient: April Harrison  Procedure(s) Performed: DILATATION AND CURETTAGE /HYSTEROSCOPY & MYOSURE POYLPECTOMY AND MYOMECTOMY (N/A )  Patient location during evaluation: PACU Anesthesia Type: General Level of consciousness: awake and alert Pain management: pain level controlled Vital Signs Assessment: post-procedure vital signs reviewed and stable Respiratory status: spontaneous breathing, nonlabored ventilation and respiratory function stable Cardiovascular status: blood pressure returned to baseline and stable Postop Assessment: no apparent nausea or vomiting Anesthetic complications: no     Last Vitals:  Vitals:   08/29/19 1803 08/29/19 1812  BP: 115/84 125/67  Pulse: 73   Resp:    Temp:    SpO2: 94%     Last Pain:  Vitals:   08/29/19 1812  TempSrc:   PainSc: 0-No pain                 Tera Mater

## 2019-09-02 LAB — SURGICAL PATHOLOGY

## 2019-09-05 ENCOUNTER — Other Ambulatory Visit: Payer: Self-pay

## 2019-09-05 ENCOUNTER — Encounter: Payer: Self-pay | Admitting: Obstetrics and Gynecology

## 2019-09-05 ENCOUNTER — Ambulatory Visit (INDEPENDENT_AMBULATORY_CARE_PROVIDER_SITE_OTHER): Payer: Medicare Other | Admitting: Obstetrics and Gynecology

## 2019-09-05 VITALS — BP 120/72 | Ht 59.0 in | Wt 292.0 lb

## 2019-09-05 DIAGNOSIS — Z4889 Encounter for other specified surgical aftercare: Secondary | ICD-10-CM

## 2019-09-05 NOTE — Progress Notes (Signed)
      Postoperative Follow-up Patient presents post op from hysteroscopy, polypectomy/myomectomy 1weeks ago for PMB.  Subjective: Patient reports marked improvement in her preop symptoms. Eating a regular diet without difficulty. The patient is not having any pain.  Activity: normal activities of daily living.  Objective: Blood pressure 120/72, height 4\' 11"  (1.499 m), weight 292 lb (132.5 kg).  General: NAD Pulmonary: no increased work of breathing Extremities: no edema Neurologic: normal gait    Admission on 08/29/2019, Discharged on 08/29/2019  Component Date Value Ref Range Status  . Glucose-Capillary 08/29/2019 179* 70 - 99 mg/dL Final   Glucose reference range applies only to samples taken after fasting for at least 8 hours.  . SURGICAL PATHOLOGY 08/29/2019    Final-Edited                   Value:SURGICAL PATHOLOGY CASE: [id number] PATIENT: April Harrison Surgical Pathology Report     Specimen Submitted: A. Endometrial curettings, polyps, fibroid  Clinical History: Postmenopausal bleeding N95.0.  Abnormal appearance endometrium R93.5    DIAGNOSIS: A. ENDOMETRIUM; CURETTAGE AND POLYPECTOMY: - FRAGMENTS OF BENIGN ENDOMETRIAL POLYP WITH ASSOCIATED NONATYPICAL HYPERPLASIA. 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 - NEGATIVE FOR ATYPICAL HYPERPLASIA/EIN AND MALIGNANCY.  GROSS DESCRIPTION: A. Labeled: Endometrial curettings, polyp, and fibroid Received: Formalin Tissue fragment(s): Multiple Size: Aggregate, 2.5 x 1.7 x 0.4 cm Description: Received are irregular fragments of tan soft tissue admixed with minimal, hemorrhagic material.  Filtered into a mesh bag. Entirely submitted in cassette 1.    Final Diagnosis performed by Allena Napoleon, MD.   Electronically signed 09/02/2019 9:45:28AM The electronic signature indicates that the                          named Attending Pathologist has evaluated the specimen Technical component  performed at [facility], [address], [place], [place] [address] Lab: [phone] Dir: Rush Farmer, MD, MMM  Professional component performed at [facility], [facility], [place], [place], [place] [address] Lab: [phone] Dir: Dellia [place]. Rubinas, MD   . Glucose-Capillary 08/29/2019 135* 70 - 99 mg/dL Final   Glucose reference range applies only to samples taken after fasting for at least 8 hours.    Assessment: 64 y.o. s/p hysteroscopy, polypectomy/myomectomy stable  Plan: Patient has done well after surgery with no apparent complications.  I have discussed the post-operative course to date, and the expected progress moving forward.  The patient understands what complications to be concerned about.  I will see the patient in routine follow up, or sooner if needed.    Activity plan: No restriction.   Malachy Mood, MD, [facility], [facility] 09/05/2019, 5:07 PM

## 2019-10-18 ENCOUNTER — Encounter: Payer: Self-pay | Admitting: Obstetrics and Gynecology

## 2019-10-18 ENCOUNTER — Ambulatory Visit (INDEPENDENT_AMBULATORY_CARE_PROVIDER_SITE_OTHER): Payer: Medicare Other | Admitting: Obstetrics and Gynecology

## 2019-10-18 ENCOUNTER — Other Ambulatory Visit: Payer: Self-pay

## 2019-10-18 VITALS — BP 132/76 | Ht 59.0 in | Wt 284.0 lb

## 2019-10-18 DIAGNOSIS — N8501 Benign endometrial hyperplasia: Secondary | ICD-10-CM

## 2019-10-18 DIAGNOSIS — N950 Postmenopausal bleeding: Secondary | ICD-10-CM

## 2019-10-18 DIAGNOSIS — Z4889 Encounter for other specified surgical aftercare: Secondary | ICD-10-CM

## 2019-10-18 MED ORDER — NORETHINDRONE ACETATE 5 MG PO TABS: 5.0000 mg | ORAL_TABLET | Freq: Every day | ORAL | 6 refills | Status: DC

## 2019-10-18 NOTE — Progress Notes (Signed)
° ° ° ° °  Postoperative Follow-up Patient presents post op from hysteroscopy, D&C 6weeks ago for PMB and endoemtrial polyp.  Subjective: Patient reports marked improvement in her preop symptoms. Eating a regular diet without difficulty. The patient is not having any pain.  Activity: normal activities of daily living. Still having some spotting postoperatively  Objective: Blood pressure 132/76, height 4\' 11"  (1.499 m), weight 284 lb (128.8 kg).  General: NAD Pulmonary: no increased work of breathing Abdomen: soft, non-tender, non-distended, incision GU: normal external female genitalia normal cervix, no CMT, uterus normal in shape and contour, no adnexal tenderness or masses Extremities: no edema Neurologic: normal gait    Admission on 08/29/2019, Discharged on 08/29/2019  Component Date Value Ref Range Status   Glucose-Capillary 08/29/2019 179* 70 - 99 mg/dL Final   Glucose reference range applies only to samples taken after fasting for at least 8 hours.   SURGICAL PATHOLOGY 08/29/2019    Final-Edited                   Value:SURGICAL PATHOLOGY CASE: [id number] PATIENT: Deseray Distel Surgical Pathology Report     Specimen Submitted: A. Endometrial curettings, polyps, fibroid  Clinical History: Postmenopausal bleeding N95.0.  Abnormal appearance endometrium R93.5    DIAGNOSIS: A. ENDOMETRIUM; CURETTAGE AND POLYPECTOMY: - FRAGMENTS OF BENIGN ENDOMETRIAL POLYP WITH ASSOCIATED NONATYPICAL HYPERPLASIA. 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 - NEGATIVE FOR ATYPICAL HYPERPLASIA/EIN AND MALIGNANCY.  GROSS DESCRIPTION: A. Labeled: Endometrial curettings, polyp, and fibroid Received: Formalin Tissue fragment(s): Multiple Size: Aggregate, 2.5 x 1.7 x 0.4 cm Description: Received are irregular fragments of tan soft tissue admixed with minimal, hemorrhagic material.  Filtered into a mesh bag. Entirely submitted in cassette 1.    Final Diagnosis  performed by Allena Napoleon, MD.   Electronically signed 09/02/2019 9:45:28AM The electronic signature indicates that the                          named Attending Pathologist has evaluated the specimen Technical component performed at [facility], [address], [place], [place] [address] Lab: [phone] Dir: Rush Farmer, MD, MMM  Professional component performed at [facility], [facility], [place], [place], [place] [address] Lab: [phone] Dir: Dellia [place]. Rubinas, MD    Glucose-Capillary 08/29/2019 135* 70 - 99 mg/dL Final   Glucose reference range applies only to samples taken after fasting for at least 8 hours.    Assessment: 64 y.o. s/p hysteroscopy D&C stable  Plan: Patient has done well after surgery with no apparent complications.  I have discussed the post-operative course to date, and the expected progress moving forward.  The patient understands what complications to be concerned about.  I will see the patient in routine follow up, or sooner if needed.    Activity plan: No restriction.     Will start patient on norethindrone 5mg  po daily for 6 months.  Consider repeat biopsy at 6 months to verify no residual hyperplasia   Malachy Mood, MD, [facility] 10/18/2019, 10:39 AM

## 2019-11-12 ENCOUNTER — Telehealth: Payer: Self-pay

## 2019-11-12 NOTE — Telephone Encounter (Signed)
Pt calling to inform her doctor her situation from surg has increased dramatically.  Needs to see him ASAP.  [phone]  Pt states she is saturating a pad q30-1hr.  Adv our policy is for her to go to the ED.  Pt wants Hyst scheduled ASAP.  Pt aware AMS not in office this week.

## 2019-11-13 NOTE — Telephone Encounter (Signed)
Pt calling; states this is too much for her and doesn't know what to do with all this bleeding.  [phone]  Pt apologized for calling again; adv that's what I'm here for; she didn't go to the ED yesterday as adv b/c of having to wait for yours; is still saturating a pad q100min-1hr; adv again to go to ED; pt states she has appt c AMS 7/21.  Adv I couldn't make her to go the ED; adv if she started feeling bad, weak she needed to go.  Pt stated she would.

## 2019-11-18 ENCOUNTER — Other Ambulatory Visit: Payer: Self-pay | Admitting: Obstetrics and Gynecology

## 2019-11-18 DIAGNOSIS — N939 Abnormal uterine and vaginal bleeding, unspecified: Secondary | ICD-10-CM

## 2019-11-18 NOTE — Telephone Encounter (Signed)
Called and left voicemail for patient to call back to be scheduled. 

## 2019-11-18 NOTE — Telephone Encounter (Signed)
I put in labs for today and I have an opening tomorrow at 2:30 that I can see her.  My advice is still if heavy bleeding go to ER for quickest evaluation.

## 2019-11-22 ENCOUNTER — Other Ambulatory Visit: Payer: Self-pay

## 2019-11-22 ENCOUNTER — Other Ambulatory Visit: Payer: Medicare Other

## 2019-11-23 LAB — CBC
Hematocrit: 39.8 % (ref 34.0–46.6)
Hemoglobin: 12.6 g/dL (ref 11.1–15.9)
MCH: 25 pg — ABNORMAL LOW (ref 26.6–33.0)
MCHC: 31.7 g/dL (ref 31.5–35.7)
MCV: 79 fL (ref 79–97)
Platelets: 178 10*3/uL (ref 150–450)
RBC: 5.04 x10E6/uL (ref 3.77–5.28)
RDW: 16.5 % — ABNORMAL HIGH (ref 11.7–15.4)
WBC: 7.9 10*3/uL (ref 3.4–10.8)

## 2019-11-27 ENCOUNTER — Encounter: Payer: Self-pay | Admitting: Obstetrics and Gynecology

## 2019-11-27 ENCOUNTER — Other Ambulatory Visit: Payer: Self-pay

## 2019-11-27 ENCOUNTER — Ambulatory Visit (INDEPENDENT_AMBULATORY_CARE_PROVIDER_SITE_OTHER): Payer: Medicare Other | Admitting: Obstetrics and Gynecology

## 2019-11-27 VITALS — BP 130/90 | Ht 59.0 in | Wt 283.0 lb

## 2019-11-27 DIAGNOSIS — B373 Candidiasis of vulva and vagina: Secondary | ICD-10-CM | POA: Diagnosis not present

## 2019-11-27 DIAGNOSIS — N950 Postmenopausal bleeding: Secondary | ICD-10-CM

## 2019-11-27 DIAGNOSIS — B3731 Acute candidiasis of vulva and vagina: Secondary | ICD-10-CM

## 2019-11-27 MED ORDER — NYSTATIN 100000 UNIT/GM EX CREA: 1.0000 "application " | TOPICAL_CREAM | Freq: Two times a day (BID) | CUTANEOUS | 1 refills | Status: DC

## 2019-11-27 MED ORDER — FLUCONAZOLE 150 MG PO TABS: 150.0000 mg | ORAL_TABLET | Freq: Once | ORAL | 0 refills | Status: AC

## 2019-11-27 MED ORDER — NORETHINDRONE ACETATE 5 MG PO TABS: ORAL_TABLET | ORAL | 6 refills | Status: DC

## 2019-11-27 NOTE — Progress Notes (Signed)
Gynecology Abnormal Uterine Bleeding Initial Evaluation   Chief Complaint:  Chief Complaint  Patient presents with  . Menorrhagia    History of Present Illness:    Paitient is a 64 y.o. G1P1 who LMP was No LMP recorded. Patient is postmenopausal., presents today for a problem visit.  She complains of heavy daily vaginal bleeding.  No bleeding today.  The patient underwent work up for postmenopausal bleeding and thickened endometrium on 08/29/2019.  Findings revealed on partial submucosal fibroid which was calcified, and simple endometrial hyperplasia without atypia for which she has been placed on norethindrone 5mg  daily.  With plan to resample lining sometime this fall.  Also reports symptoms consistent with vaginal candida.  Vulvar irritation that she attributes to the need for wearing pad secondary to the bleeding.    Review of Systems: Review of Systems  Constitutional: Negative.   Gastrointestinal: Negative.   Genitourinary: Negative.   Skin: Positive for itching. Negative for rash.  Endo/Heme/Allergies: Does not bruise/bleed easily.    Past Medical History:  Patient Active Problem List   Diagnosis Date Noted  . Endometrial hyperplasia without atypia, simple 10/18/2019  . Postmenopausal bleeding   . Endometrial polyp   . Thickened endometrium   . Submucous leiomyoma of uterus   . Special screening for malignant neoplasms, colon   . SOB (shortness of breath) 08/17/2010  . Obesity 08/17/2010  . HTN (hypertension) 08/17/2010  . Diabetes mellitus 08/17/2010    Past Surgical History:  Past Surgical History:  Procedure Laterality Date  . CESAREAN SECTION    . COLONOSCOPY WITH PROPOFOL N/A 07/21/2017   Procedure: COLONOSCOPY WITH PROPOFOL;  Surgeon: Lin Landsman, MD;  Location: [facility];  Service: Gastroenterology;  Laterality: N/A;  . HARDWARE REMOVAL Left    knee  . HYSTEROSCOPY WITH D & C N/A 08/29/2019   Procedure: DILATATION AND CURETTAGE /HYSTEROSCOPY  & MYOSURE POYLPECTOMY AND MYOMECTOMY;  Surgeon: Malachy Mood, MD;  Location: [facility] ORS;  Service: Gynecology;  Laterality: N/A;  . KNEE SURGERY Left     Obstetric History: G1P1  Family History:  Family History  Problem Relation Age of Onset  . Breast cancer Neg Hx     Social History:  Social History   Socioeconomic History  . Marital status: Single    Spouse name: Not on file  . Number of children: Not on file  . Years of education: Not on file  . Highest education level: Not on file  Occupational History  . Not on file  Tobacco Use  . Smoking status: Never Smoker  . Smokeless tobacco: Never Used  Vaping Use  . Vaping Use: Never used  Substance and Sexual Activity  . Alcohol use: No  . Drug use: No  . Sexual activity: Not Currently    Birth control/protection: None  Other Topics Concern  . Not on file  Social History Narrative  . Not on file   Social Determinants of Health   Financial Resource Strain:   . Difficulty of Paying Living Expenses:   Food Insecurity:   . Worried About Charity fundraiser in the Last Year:   . Arboriculturist in the Last Year:   Transportation Needs:   . Film/video editor (Medical):   Marland Kitchen Lack of Transportation (Non-Medical):   Physical Activity:   . Days of Exercise per Week:   . Minutes of Exercise per Session:   Stress:   . Feeling of Stress :   Social  Connections:   . Frequency of Communication with Friends and Family:   . Frequency of Social Gatherings with Friends and Family:   . Attends Religious Services:   . Active Member of Clubs or Organizations:   . Attends Archivist Meetings:   Marland Kitchen Marital Status:   Intimate Partner Violence:   . Fear of Current or Ex-Partner:   . Emotionally Abused:   Marland Kitchen Physically Abused:   . Sexually Abused:     Allergies:  Allergies  Allergen Reactions  . Neurontin [Gabapentin] Rash    Medications: Prior to Admission medications   Medication Sig Start Date End Date  Taking? Authorizing Provider  Ascorbic Acid (VITAMIN C) 1000 MG tablet Take 1,000 mg by mouth daily.   Yes [provider]  aspirin 81 MG tablet Take 81 mg by mouth daily.     Yes [provider]  atorvastatin (LIPITOR) 10 MG tablet Take 10 mg by mouth daily. 05/28/19  Yes [provider]  Calcium Carb-Cholecalciferol (CALCIUM/VITAMIN D) 600-400 MG-UNIT TABS Take 1 tablet by mouth daily.   Yes [provider]  cetirizine (ZYRTEC) 10 MG tablet Take 10 mg by mouth daily.     Yes [provider]  Fluticasone-Umeclidin-Vilant (TRELEGY ELLIPTA) 100-62.5-25 MCG/INH AEPB Inhale 1 puff into the lungs daily.   Yes [provider]  furosemide (LASIX) 20 MG tablet Take 20 mg by mouth daily. 05/28/19  Yes [provider]  glucose blood (TRUETRACK TEST) test strip 1 each by Other route as needed. Use as instructed    Yes [provider]  insulin glargine (LANTUS) 100 UNIT/ML injection Inject 40 Units into the skin 2 (two) times daily.    Yes [provider]  JARDIANCE 10 MG TABS tablet Take 10 mg by mouth daily. 07/30/19  Yes [provider]  KRILL OIL OMEGA-3 PO Take 1 capsule by mouth daily.   Yes [provider]  lisinopril-hydrochlorothiazide (PRINZIDE,ZESTORETIC) 20-25 MG per tablet Take 1 tablet by mouth daily.     Yes [provider]  metoprolol tartrate (LOPRESSOR) 25 MG tablet Take 25 mg by mouth 2 (two) times daily.     Yes [provider]  Multiple Vitamins-Minerals (MULTIVITAMIN WITH MINERALS) tablet Take 1 tablet by mouth daily.   Yes [provider]  norethindrone (AYGESTIN) 5 MG tablet Take 2 tablets (10 mg total) by mouth daily for 7 days, THEN 1 tablet (5 mg total) daily. 11/27/19 12/03/20 Yes Malachy Mood, MD  Semaglutide (RYBELSUS) 7 MG TABS Take 1 tablet by mouth daily.   Yes [provider]  sertraline (ZOLOFT) 100 MG tablet Take 100 mg by mouth daily.    Yes  [provider]  zinc gluconate 50 MG tablet Take 50 mg by mouth daily.   Yes [provider]  nystatin cream (MYCOSTATIN) Apply 1 application topically 2 (two) times daily. 11/27/19   Malachy Mood, MD    Physical Exam Blood pressure 130/90, height 4\' 11"  (1.499 m), weight 283 lb (128.4 kg).  No LMP recorded. Patient is postmenopausal.  General: NAD, well nourished, appears stated age 12: normocephalic, anicteric Pulmonary: No increased work of breathing Cardiovascular: RRR, distal pulses 2+ Abdomen: soft, non-tender, non-distended.  Umbilicus without lesions.  No hepatomegaly, splenomegaly or masses palpable. No evidence of hernia  Genitourinary:  External: Normal external female genitalia.  Normal urethral meatus, normal Bartholin's and Skene's glands.    Vagina: Normal vaginal mucosa, no evidence of prolapse.  Thick white discharge noted  Cervix: Grossly normal in appearance, no bleeding  Rectal: deferred  Lymphatic: no evidence of inguinal lymphadenopathy Extremities: no edema, erythema, or tenderness Neurologic: Grossly intact Psychiatric: mood appropriate, affect full  Female chaperone present for pelvic portions of the physical exam  Assessment: 64 y.o. G1P1 with abnormal uterine bleeding  Plan: Problem List Items Addressed This Visit    None    Visit Diagnoses    PMB (postmenopausal bleeding)    -  Primary   Vaginal candida       Relevant Medications   nystatin cream (MYCOSTATIN)      1) PMB  - CBC H&H and MCV normal 11/22/2019, ans stable from values obtained 11/15/2019 at [facility] - No evidence of any vaginal bleeding on exam today - Can briefly increase norethindrone to 10mg .  Plan on repeat biopsy this fall to follow up on endometrial hyperplasia without atypia.   - The patient inquires about hysterectomy but we discussed Body mass index is 57.16 kg/m., HTN, and diabetes make her high risk for a surgery such as hysterectomy.  2) A total of  15 minutes were spent in face-to-face contact with the patient during this encounter with over half of that time devoted to counseling and coordination of care.  3) Vaginal candida - Rx diflucan  4) Return in about 5 months (around 04/28/2020) for Follow up and endometrial biopsy.   Malachy Mood, MD, [facility], [place]

## 2019-12-23 ENCOUNTER — Telehealth: Payer: Self-pay | Admitting: Obstetrics and Gynecology

## 2019-12-23 NOTE — Telephone Encounter (Signed)
Left patient a voicemail to return call for further scheduling.Referral is attached for reference.

## 2019-12-25 ENCOUNTER — Telehealth: Payer: Self-pay | Admitting: Obstetrics and Gynecology

## 2019-12-25 NOTE — Telephone Encounter (Signed)
Pt called in and stated that she was returning Maggie's call about her referral and that she wants to move forward with getting a 2nd opinion regarding hysterectomy. The pt was told our providers in the office and the pt requested a female. I told her that Dr. Marcelline Mates is booked till December. The pt is requesting to be seen sooner than that. I told her I will send a message and that we will call her back once we speak to the provider. The pt verbally understood. Please advise

## 2019-12-26 NOTE — Telephone Encounter (Signed)
My meeting for next Tuesday afternoon is cancelled. We can open up the afternoon schedule.

## 2019-12-27 NOTE — Telephone Encounter (Signed)
There is no one on my schedule on Aug 24th from 1:30-3:00 because of a meeting that is now cancelled.  That block can be opened up and she can be put anywhere in that time period.   Rubie Maid, MD

## 2019-12-27 NOTE — Telephone Encounter (Signed)
I went to the pts appt desk to make the appt but someone has already taken the appointment times. Do you have another day that will work for the pt?

## 2019-12-27 NOTE — Telephone Encounter (Signed)
Dr. Marcelline Mates im sorry I don't know what I was thinking, yes sorry you had to go into detail. Its been a crazy day. Ill contact the pt thank you

## 2019-12-31 ENCOUNTER — Encounter: Payer: Self-pay | Admitting: Obstetrics and Gynecology

## 2020-03-12 ENCOUNTER — Institutional Professional Consult (permissible substitution): Payer: Medicare Other | Admitting: Plastic Surgery

## 2020-03-13 ENCOUNTER — Encounter: Payer: Self-pay | Admitting: Obstetrics and Gynecology

## 2020-03-30 ENCOUNTER — Other Ambulatory Visit: Payer: Self-pay | Admitting: Obstetrics & Gynecology

## 2020-04-07 ENCOUNTER — Encounter: Payer: Self-pay | Admitting: Obstetrics and Gynecology

## 2020-04-24 ENCOUNTER — Ambulatory Visit: Payer: Medicare Other | Admitting: Obstetrics and Gynecology

## 2020-05-22 ENCOUNTER — Other Ambulatory Visit: Payer: Medicare HMO

## 2020-05-27 ENCOUNTER — Other Ambulatory Visit: Payer: Medicare HMO

## 2020-05-29 ENCOUNTER — Ambulatory Visit: Admit: 2020-05-29 | Payer: Medicare HMO | Admitting: Obstetrics & Gynecology

## 2020-05-29 SURGERY — HYSTERECTOMY, VAGINAL
Anesthesia: General

## 2020-06-09 ENCOUNTER — Other Ambulatory Visit: Payer: Self-pay | Admitting: Family

## 2020-06-09 DIAGNOSIS — Z1231 Encounter for screening mammogram for malignant neoplasm of breast: Secondary | ICD-10-CM

## 2020-07-15 ENCOUNTER — Ambulatory Visit
Admission: RE | Admit: 2020-07-15 | Discharge: 2020-07-15 | Disposition: A | Discharge status: Home / Self Care | Payer: Medicare Other | Source: Ambulatory Visit | Attending: Family | Admitting: Family

## 2020-07-15 ENCOUNTER — Other Ambulatory Visit: Payer: Self-pay

## 2020-07-15 DIAGNOSIS — Z1231 Encounter for screening mammogram for malignant neoplasm of breast: Secondary | ICD-10-CM | POA: Diagnosis present

## 2020-09-18 DIAGNOSIS — B9689 Other specified bacterial agents as the cause of diseases classified elsewhere: Secondary | ICD-10-CM | POA: Diagnosis not present

## 2020-09-18 DIAGNOSIS — J019 Acute sinusitis, unspecified: Secondary | ICD-10-CM | POA: Diagnosis not present

## 2020-09-18 DIAGNOSIS — U071 COVID-19: Secondary | ICD-10-CM | POA: Diagnosis not present

## 2020-09-18 DIAGNOSIS — Z03818 Encounter for observation for suspected exposure to other biological agents ruled out: Secondary | ICD-10-CM | POA: Diagnosis not present

## 2020-10-01 DIAGNOSIS — E1142 Type 2 diabetes mellitus with diabetic polyneuropathy: Secondary | ICD-10-CM | POA: Diagnosis not present

## 2020-10-01 DIAGNOSIS — H40003 Preglaucoma, unspecified, bilateral: Secondary | ICD-10-CM | POA: Diagnosis not present

## 2020-10-01 DIAGNOSIS — E119 Type 2 diabetes mellitus without complications: Secondary | ICD-10-CM | POA: Diagnosis not present

## 2020-10-01 DIAGNOSIS — E559 Vitamin D deficiency, unspecified: Secondary | ICD-10-CM | POA: Diagnosis not present

## 2020-10-01 DIAGNOSIS — E782 Mixed hyperlipidemia: Secondary | ICD-10-CM | POA: Diagnosis not present

## 2020-10-01 DIAGNOSIS — I10 Essential (primary) hypertension: Secondary | ICD-10-CM | POA: Diagnosis not present

## 2020-12-07 DIAGNOSIS — R300 Dysuria: Secondary | ICD-10-CM | POA: Diagnosis not present

## 2020-12-07 DIAGNOSIS — M545 Low back pain, unspecified: Secondary | ICD-10-CM | POA: Diagnosis not present

## 2020-12-07 DIAGNOSIS — E782 Mixed hyperlipidemia: Secondary | ICD-10-CM | POA: Diagnosis not present

## 2020-12-07 DIAGNOSIS — N209 Urinary calculus, unspecified: Secondary | ICD-10-CM | POA: Diagnosis not present

## 2021-02-19 DIAGNOSIS — R5383 Other fatigue: Secondary | ICD-10-CM | POA: Diagnosis not present

## 2021-02-19 DIAGNOSIS — D519 Vitamin B12 deficiency anemia, unspecified: Secondary | ICD-10-CM | POA: Diagnosis not present

## 2021-02-19 DIAGNOSIS — E538 Deficiency of other specified B group vitamins: Secondary | ICD-10-CM | POA: Diagnosis not present

## 2021-02-19 DIAGNOSIS — E559 Vitamin D deficiency, unspecified: Secondary | ICD-10-CM | POA: Diagnosis not present

## 2021-02-19 DIAGNOSIS — Z23 Encounter for immunization: Secondary | ICD-10-CM | POA: Diagnosis not present

## 2021-02-19 DIAGNOSIS — E1142 Type 2 diabetes mellitus with diabetic polyneuropathy: Secondary | ICD-10-CM | POA: Diagnosis not present

## 2021-02-19 DIAGNOSIS — E782 Mixed hyperlipidemia: Secondary | ICD-10-CM | POA: Diagnosis not present

## 2021-02-19 DIAGNOSIS — I10 Essential (primary) hypertension: Secondary | ICD-10-CM | POA: Diagnosis not present

## 2021-03-01 DIAGNOSIS — E1142 Type 2 diabetes mellitus with diabetic polyneuropathy: Secondary | ICD-10-CM | POA: Diagnosis not present

## 2021-03-01 DIAGNOSIS — E782 Mixed hyperlipidemia: Secondary | ICD-10-CM | POA: Diagnosis not present

## 2021-03-01 DIAGNOSIS — B029 Zoster without complications: Secondary | ICD-10-CM | POA: Diagnosis not present

## 2021-03-01 DIAGNOSIS — I10 Essential (primary) hypertension: Secondary | ICD-10-CM | POA: Diagnosis not present

## 2021-03-22 DIAGNOSIS — E782 Mixed hyperlipidemia: Secondary | ICD-10-CM | POA: Diagnosis not present

## 2021-03-22 DIAGNOSIS — E1169 Type 2 diabetes mellitus with other specified complication: Secondary | ICD-10-CM | POA: Diagnosis not present

## 2021-03-22 DIAGNOSIS — D519 Vitamin B12 deficiency anemia, unspecified: Secondary | ICD-10-CM | POA: Diagnosis not present

## 2021-03-22 DIAGNOSIS — I10 Essential (primary) hypertension: Secondary | ICD-10-CM | POA: Diagnosis not present

## 2021-03-22 DIAGNOSIS — M25549 Pain in joints of unspecified hand: Secondary | ICD-10-CM | POA: Diagnosis not present

## 2021-03-22 DIAGNOSIS — M2550 Pain in unspecified joint: Secondary | ICD-10-CM | POA: Diagnosis not present

## 2021-03-31 ENCOUNTER — Other Ambulatory Visit: Payer: Self-pay

## 2021-03-31 ENCOUNTER — Encounter: Payer: Self-pay | Admitting: Podiatry

## 2021-03-31 ENCOUNTER — Encounter (INDEPENDENT_AMBULATORY_CARE_PROVIDER_SITE_OTHER): Payer: Self-pay

## 2021-03-31 ENCOUNTER — Ambulatory Visit (INDEPENDENT_AMBULATORY_CARE_PROVIDER_SITE_OTHER): Payer: Medicare Other | Admitting: Podiatry

## 2021-03-31 DIAGNOSIS — E1142 Type 2 diabetes mellitus with diabetic polyneuropathy: Secondary | ICD-10-CM

## 2021-03-31 DIAGNOSIS — L600 Ingrowing nail: Secondary | ICD-10-CM | POA: Diagnosis not present

## 2021-03-31 NOTE — Progress Notes (Signed)
  Subjective:  Patient ID: April Harrison, female    DOB: [birth year],  MRN: [id number]  Chief Complaint  Patient presents with   Diabetes      np-burning on the bottom of both feet and nails need to be check on both feet    65 y.o. female presents with the above complaint. History confirmed with patient.  Presents for diabetic foot examination as well as occasional ingrown toenail of the left hallux.  She has not had any major infection of the hallux nail.  She notes burning and tingling in both feet.  Objective:  Physical Exam: warm, good capillary refill, no trophic changes or ulcerative lesions, normal DP and PT pulses, normal monofilament exam, normal sensory exam, and mild incurvation of hallux nail medial border.  Assessment:   1. Ingrowing left great toenail   2. Type 2 diabetes mellitus with peripheral neuropathy (HCC)      Plan:  Patient was evaluated and treated and all questions answered.  Debrided the ingrown nail in a slant back fashion.  This alleviated the pain and pressure.  Return as needed for this.  We also discussed permanent partial nail avulsion if this becomes recurrent issue or is causing infection  Patient educated on diabetes. Discussed proper diabetic foot care and discussed risks and complications of disease. Educated patient in depth on reasons to return to the office immediately should he/she discover anything concerning or new on the feet. All questions answered. Discussed proper shoes as well.   Discussed with her she has peripheral neuropathy that is likely causing the numbness and tingling and burning.  We discussed treatment with medication she has not tolerated gabapentin before and actually had an allergic reaction with a rash to it.  Has not tried Lyrica but is concerned about side effects.  Discussed using topical lidocaine cream over-the-counter for this as needed.  Emphasize importance of checking feet daily  Return in about 1 year (around  03/31/2022) for diabetic foot exam, as needed for painful ingrowing nail .

## 2021-04-22 DIAGNOSIS — I10 Essential (primary) hypertension: Secondary | ICD-10-CM | POA: Diagnosis not present

## 2021-04-22 DIAGNOSIS — E1165 Type 2 diabetes mellitus with hyperglycemia: Secondary | ICD-10-CM | POA: Diagnosis not present

## 2021-04-22 DIAGNOSIS — M5440 Lumbago with sciatica, unspecified side: Secondary | ICD-10-CM | POA: Diagnosis not present

## 2021-04-22 DIAGNOSIS — E782 Mixed hyperlipidemia: Secondary | ICD-10-CM | POA: Diagnosis not present

## 2021-05-12 DIAGNOSIS — M2550 Pain in unspecified joint: Secondary | ICD-10-CM | POA: Diagnosis not present

## 2021-05-12 DIAGNOSIS — Z7960 Long term (current) use of unspecified immunomodulators and immunosuppressants: Secondary | ICD-10-CM | POA: Diagnosis not present

## 2021-05-12 DIAGNOSIS — M19042 Primary osteoarthritis, left hand: Secondary | ICD-10-CM | POA: Diagnosis not present

## 2021-05-12 DIAGNOSIS — M19072 Primary osteoarthritis, left ankle and foot: Secondary | ICD-10-CM | POA: Diagnosis not present

## 2021-05-12 DIAGNOSIS — M19071 Primary osteoarthritis, right ankle and foot: Secondary | ICD-10-CM | POA: Diagnosis not present

## 2021-05-12 DIAGNOSIS — M19041 Primary osteoarthritis, right hand: Secondary | ICD-10-CM | POA: Diagnosis not present

## 2021-05-12 DIAGNOSIS — M7732 Calcaneal spur, left foot: Secondary | ICD-10-CM | POA: Diagnosis not present

## 2021-05-12 DIAGNOSIS — M7731 Calcaneal spur, right foot: Secondary | ICD-10-CM | POA: Diagnosis not present

## 2021-05-25 DIAGNOSIS — E782 Mixed hyperlipidemia: Secondary | ICD-10-CM | POA: Diagnosis not present

## 2021-05-25 DIAGNOSIS — I10 Essential (primary) hypertension: Secondary | ICD-10-CM | POA: Diagnosis not present

## 2021-05-25 DIAGNOSIS — E1142 Type 2 diabetes mellitus with diabetic polyneuropathy: Secondary | ICD-10-CM | POA: Diagnosis not present

## 2021-05-25 DIAGNOSIS — G4733 Obstructive sleep apnea (adult) (pediatric): Secondary | ICD-10-CM | POA: Diagnosis not present

## 2021-05-25 DIAGNOSIS — F32A Depression, unspecified: Secondary | ICD-10-CM | POA: Diagnosis not present

## 2021-06-21 ENCOUNTER — Other Ambulatory Visit: Payer: Self-pay | Admitting: Family

## 2021-06-21 DIAGNOSIS — Z1231 Encounter for screening mammogram for malignant neoplasm of breast: Secondary | ICD-10-CM

## 2021-06-24 DIAGNOSIS — M65322 Trigger finger, left index finger: Secondary | ICD-10-CM | POA: Diagnosis not present

## 2021-06-24 DIAGNOSIS — F411 Generalized anxiety disorder: Secondary | ICD-10-CM | POA: Diagnosis not present

## 2021-06-24 DIAGNOSIS — E559 Vitamin D deficiency, unspecified: Secondary | ICD-10-CM | POA: Diagnosis not present

## 2021-06-24 DIAGNOSIS — D519 Vitamin B12 deficiency anemia, unspecified: Secondary | ICD-10-CM | POA: Diagnosis not present

## 2021-06-24 DIAGNOSIS — M1A0710 Idiopathic chronic gout, right ankle and foot, without tophus (tophi): Secondary | ICD-10-CM | POA: Diagnosis not present

## 2021-06-24 DIAGNOSIS — E782 Mixed hyperlipidemia: Secondary | ICD-10-CM | POA: Diagnosis not present

## 2021-06-24 DIAGNOSIS — M7061 Trochanteric bursitis, right hip: Secondary | ICD-10-CM | POA: Diagnosis not present

## 2021-06-24 DIAGNOSIS — M2550 Pain in unspecified joint: Secondary | ICD-10-CM | POA: Diagnosis not present

## 2021-06-24 DIAGNOSIS — M7062 Trochanteric bursitis, left hip: Secondary | ICD-10-CM | POA: Diagnosis not present

## 2021-06-24 DIAGNOSIS — I10 Essential (primary) hypertension: Secondary | ICD-10-CM | POA: Diagnosis not present

## 2021-06-24 DIAGNOSIS — E1142 Type 2 diabetes mellitus with diabetic polyneuropathy: Secondary | ICD-10-CM | POA: Diagnosis not present

## 2021-06-24 DIAGNOSIS — G4733 Obstructive sleep apnea (adult) (pediatric): Secondary | ICD-10-CM | POA: Diagnosis not present

## 2021-07-23 DIAGNOSIS — E782 Mixed hyperlipidemia: Secondary | ICD-10-CM | POA: Diagnosis not present

## 2021-07-23 DIAGNOSIS — I10 Essential (primary) hypertension: Secondary | ICD-10-CM | POA: Diagnosis not present

## 2021-07-23 DIAGNOSIS — E1165 Type 2 diabetes mellitus with hyperglycemia: Secondary | ICD-10-CM | POA: Diagnosis not present

## 2021-07-26 ENCOUNTER — Other Ambulatory Visit: Payer: Self-pay

## 2021-07-26 ENCOUNTER — Ambulatory Visit
Admission: RE | Admit: 2021-07-26 | Discharge: 2021-07-26 | Disposition: A | Discharge status: Home / Self Care | Payer: Medicare Other | Source: Ambulatory Visit | Attending: Family | Admitting: Family

## 2021-07-26 DIAGNOSIS — Z1231 Encounter for screening mammogram for malignant neoplasm of breast: Secondary | ICD-10-CM | POA: Diagnosis not present

## 2021-08-08 ENCOUNTER — Ambulatory Visit
Admission: EM | Admit: 2021-08-08 | Discharge: 2021-08-08 | Disposition: A | Discharge status: Home / Self Care | Payer: Medicare Other | Attending: Emergency Medicine | Admitting: Emergency Medicine

## 2021-08-08 ENCOUNTER — Other Ambulatory Visit: Payer: Self-pay

## 2021-08-08 DIAGNOSIS — J0100 Acute maxillary sinusitis, unspecified: Secondary | ICD-10-CM

## 2021-08-08 MED ORDER — POLYMYXIN B-TRIMETHOPRIM 10000-0.1 UNIT/ML-% OP SOLN: 1.0000 [drp] | OPHTHALMIC | 0 refills | Status: DC

## 2021-08-08 MED ORDER — DOXYCYCLINE HYCLATE 100 MG PO CAPS: 100.0000 mg | ORAL_CAPSULE | Freq: Two times a day (BID) | ORAL | 0 refills | Status: DC

## 2021-08-08 MED ORDER — GUAIFENESIN ER 600 MG PO TB12: 600.0000 mg | ORAL_TABLET | Freq: Two times a day (BID) | ORAL | 0 refills | Status: DC

## 2021-08-08 NOTE — Discharge Instructions (Signed)
Today you are being treated for a sinus infection, most sinus infections are caused by viruses therefore we must give the body time to fight it off before attempting use of antibiotics therefore on Thursday which is day 10 of your illness if you have not seen any improvement in symptoms and will be antibiotic at the pharmacy waiting for you, at that time you may take doxycycline twice daily for 7 days ? ?May attempt attempt perform sinus irrigation 2-3 times a day with a [employer]Med sinus rinse kit and distilled water.  Do not use tap water. ? ?Begin use of you can use plain over-the-counter Mucinex twice daily to break up the stickiness of the mucus so your body can clear it. ? ?Increase your oral fluid intake to thin out your mucus so that is also able for your body to clear more easily. ? ?Continue use of your nasal spray to help clear sinus passages ? ?For your eyes, most likely have a secondary bacterial infection of conjunctivitis, place 1 drop of Polytrim into each eye every 4 hours for the next 7 days ? ?If you develop any new or worsening symptoms return for reevaluation or see your primary care provider.   ?

## 2021-08-08 NOTE — ED Provider Notes (Signed)
[facility]    CSN: [id number] Arrival date & time: 08/08/21  1428      History   Chief Complaint Chief Complaint  Patient presents with   Sinus infection   Cough    HPI April Harrison is a 66 y.o. female.   Patient presents with nasal congestion, rhinorrhea, sinus pressure and productive cough for 5 days.  Endorses eye drainage and irritation for 1 day.  Tolerating food and liquids.  No known sick contacts.  Has attempted use of Synex and Flonase which have been minimally effective.  Denies fever chills, body aches, ear pain, headaches, shortness of breath or wheezing.  Past Medical History:  Diagnosis Date   Anxiety    Arthritis    Depression    Diabetes mellitus    Type 2   Family history of adverse reaction to anesthesia    sister almost died   History of chicken pox    Hypertension    Measles    Mumps    Pancreatitis    Sleep apnea    told that she doesn't have it now    Patient Active Problem List   Diagnosis Date Noted   Endometrial hyperplasia without atypia, simple 10/18/2019   Postmenopausal bleeding    Endometrial polyp    Thickened endometrium    Submucous leiomyoma of uterus    Edema 08/05/2019   Hyperlipidemia 08/05/2019   Obstructive sleep apnea 08/05/2019   Vitamin D deficiency 08/05/2019   Special screening for malignant neoplasms, colon    Degeneration of lumbar intervertebral disc 06/23/2017   SOB (shortness of breath) 08/17/2010   Obesity 08/17/2010   HTN (hypertension) 08/17/2010   Diabetes mellitus 08/17/2010    Past Surgical History:  Procedure Laterality Date   CESAREAN SECTION     COLONOSCOPY WITH PROPOFOL N/A 07/21/2017   Procedure: COLONOSCOPY WITH PROPOFOL;  Surgeon: Toney Reil, MD;  Location: [facility] ENDOSCOPY;  Service: Gastroenterology;  Laterality: N/A;   HARDWARE REMOVAL Left    knee   HYSTEROSCOPY WITH D & C N/A 08/29/2019   Procedure: DILATATION AND CURETTAGE /HYSTEROSCOPY & MYOSURE POYLPECTOMY AND  MYOMECTOMY;  Surgeon: Vena Austria, MD;  Location: [facility] ORS;  Service: Gynecology;  Laterality: N/A;   KNEE SURGERY Left     OB History     Gravida  1   Para  1   Term      Preterm      AB      Living  1      SAB      IAB      Ectopic      Multiple      Live Births               Home Medications    Prior to Admission medications   Medication Sig Start Date End Date Taking? Authorizing Provider  Ascorbic Acid (VITAMIN C) 1000 MG tablet Take 1,000 mg by mouth daily.   Yes [provider]  aspirin 81 MG tablet Take 81 mg by mouth daily.     Yes [provider]  atorvastatin (LIPITOR) 10 MG tablet Take 10 mg by mouth daily. 05/28/19  Yes [provider]  Calcium Carb-Cholecalciferol (CALCIUM/VITAMIN D) 600-400 MG-UNIT TABS Take 1 tablet by mouth daily.   Yes [provider]  cetirizine (ZYRTEC) 10 MG tablet Take 10 mg by mouth daily.     Yes [provider]  Fluticasone-Umeclidin-Vilant (TRELEGY ELLIPTA) 100-62.5-25 MCG/INH AEPB Inhale 1  puff into the lungs daily.   Yes [provider]  furosemide (LASIX) 20 MG tablet Take 20 mg by mouth daily. 05/28/19  Yes [provider]  glucose blood test strip 1 each by Other route as needed. Use as instructed    Yes [provider]  insulin glargine (LANTUS) 100 UNIT/ML injection Inject 40 Units into the skin 2 (two) times daily.    Yes [provider]  KRILL OIL OMEGA-3 PO Take 1 capsule by mouth daily.   Yes [provider]  lisinopril-hydrochlorothiazide (PRINZIDE,ZESTORETIC) 20-25 MG per tablet Take 1 tablet by mouth daily.     Yes [provider]  meloxicam (MOBIC) 15 MG tablet Meloxicam 15 MG Oral Tablet QTY: 0 tablet Days: 0 Refills: 0  Written: 07/31/19 Patient Instructions: once daily 07/31/19  Yes [provider]  metoprolol tartrate (LOPRESSOR) 25 MG tablet Take 25 mg by mouth 2 (two) times daily.     Yes  [provider]  Multiple Vitamins-Minerals (MULTIVITAMIN WITH MINERALS) tablet Take 1 tablet by mouth daily.   Yes [provider]  nystatin cream (MYCOSTATIN) Apply 1 application topically 2 (two) times daily. 11/27/19  Yes Vena Austria, MD  phentermine (ADIPEX-P) 37.5 MG tablet Take 37.5 mg by mouth every morning. 03/23/21  Yes [provider]  sertraline (ZOLOFT) 100 MG tablet Take 100 mg by mouth daily.    Yes [provider]  TRULICITY 3 MG/0.5ML SOPN  10/06/20  Yes [provider]  valACYclovir (VALTREX) 1000 MG tablet Take 1,000 mg by mouth 3 (three) times daily. 03/01/21  Yes [provider]  Zinc 50 MG TABS Zinc 50 MG Oral Tablet QTY: 0 tablet Days: 0 Refills: 0  Written: 07/31/19 Patient Instructions: once daily 07/31/19  Yes [provider]  zinc gluconate 50 MG tablet Take 50 mg by mouth daily.   Yes [provider]  Empagliflozin-Linaglip-Metform (TRIJARDY XR) 25-09-998 MG TB24 Trijardy XR 25-09-998 MG Oral Tablet Extended Release 24 Hour QTY: 90 tablet Days: 90 Refills: 1  Written: 11/22/19 Patient Instructions: take 1 tablet by mouth once daily. 11/22/19   [provider]  fluconazole (DIFLUCAN) 200 MG tablet Fluconazole 200 MG Oral Tablet QTY: 7 tablet Days: 7 Refills: 0  Written: 03/20/20 Patient Instructions: take 1 tablet by mouth daily for 5 days. 03/20/20   [provider]  JARDIANCE 10 MG TABS tablet Take 10 mg by mouth daily. 07/30/19   [provider]  norethindrone (AYGESTIN) 5 MG tablet Take 2 tablets (10 mg total) by mouth daily for 7 days, THEN 1 tablet (5 mg total) daily. 11/27/19 12/03/20  Vena Austria, MD  Semaglutide (RYBELSUS) 7 MG TABS Take 1 tablet by mouth daily.    [provider]  sertraline (ZOLOFT) 50 MG tablet Sertraline HCl 50 MG Oral Tablet QTY: 135 tablet Days: 90 Refills: 3  Written: 12/18/19 Patient Instructions: 1 and a half tabs daily 07/31/19    [provider]    Family History Family History  Problem Relation Age of Onset   Breast cancer Neg Hx     Social History Social History   Tobacco Use   Smoking status: Never   Smokeless tobacco: Never  Vaping Use   Vaping Use: Never used  Substance Use Topics   Alcohol use: No   Drug use: No     Allergies   Penicillin g and Neurontin [gabapentin]   Review of Systems Review of Systems  Constitutional: Negative.   HENT:  Positive for congestion, rhinorrhea, sinus pressure and sore throat. Negative for dental problem, drooling, ear discharge, ear pain, facial swelling, hearing loss, mouth sores, nosebleeds, postnasal drip, sinus pain, sneezing, tinnitus, trouble swallowing and voice change.   Respiratory:  Positive for cough. Negative for apnea, choking, chest tightness, shortness of breath, wheezing and stridor.   Cardiovascular: Negative.   Gastrointestinal: Negative.   Skin: Negative.   Neurological: Negative.     Physical Exam Triage Vital Signs ED Triage Vitals  Enc Vitals Group     BP 08/08/21 1438 127/82     Pulse Rate 08/08/21 1438 77     Resp 08/08/21 1438 18     Temp 08/08/21 1438 97.7 F (36.5 C)     Temp Source 08/08/21 1438 Oral     SpO2 08/08/21 1438 95 %     Weight 08/08/21 1435 270 lb (122.5 kg)     Height 08/08/21 1435 4\' 11"  (1.499 m)     Head Circumference --      Peak Flow --      Pain Score 08/08/21 1435 8     Pain Loc --      Pain Edu? --      Excl. in GC? --    No data found.  Updated Vital Signs BP 127/82 (BP Location: Left Arm)   Pulse 77   Temp 97.7 F (36.5 C) (Oral)   Resp 18   Ht 4\' 11"  (1.499 m)   Wt 270 lb (122.5 kg)   SpO2 95%   BMI 54.53 kg/m   Visual Acuity Right Eye Distance:   Left Eye Distance:   Bilateral Distance:    Right Eye Near:   Left Eye Near:    Bilateral Near:     Physical Exam Constitutional:      Appearance: Normal appearance.  HENT:     Head: Normocephalic.     Right Ear:  Tympanic membrane, ear canal and external ear normal.     Left Ear: Tympanic membrane, ear canal and external ear normal.     Nose: Congestion present. No rhinorrhea.     Mouth/Throat:     Mouth: Mucous membranes are moist.     Pharynx: Posterior oropharyngeal erythema present.  Eyes:     Extraocular Movements: Extraocular movements intact.     Comments: Erythema noted to the conjunctiva, vision grossly intact  Cardiovascular:     Rate and Rhythm: Normal rate and regular rhythm.     Pulses: Normal pulses.     Heart sounds: Normal heart sounds.  Pulmonary:     Effort: Pulmonary effort is normal.     Breath sounds: Normal breath sounds.  Musculoskeletal:     Cervical back: Normal range of motion and neck supple.  Skin:    General: Skin is warm and dry.  Neurological:     Mental Status: She is alert and oriented to person, place, and time. Mental status is at baseline.  Psychiatric:        Mood and Affect: Mood normal.        Behavior: Behavior normal.     UC Treatments / Results  Labs (all labs ordered are listed, but only abnormal results are displayed) Labs Reviewed - No data to display  EKG   Radiology No results found.  Procedures Procedures (including critical care time)  Medications Ordered in UC Medications - No data to display  Initial Impression / Assessment and Plan / UC Course  I have reviewed the triage  vital signs and the nursing notes.  Pertinent labs & imaging results that were available during my care of the patient were reviewed by me and considered in my medical decision making (see chart for details).  Acute nonrecurrent maxillary sinusitis  Vital signs are stable and patient is in no signs of distress, discussed etiology of symptoms, recommended use of Mucinex with continued use of Flonase for management of congestion, Polytrim 7-day course prescribed for conjunctivitis, watchful wait antibiotic placed at pharmacy for day 10 of illness,  doxycycline 7 days prescribed, recommended continued use of over-the-counter medications for additional comfort, may follow-up with urgent care as needed Final Clinical Impressions(s) / UC Diagnoses   Final diagnoses:  None   Discharge Instructions   None    ED Prescriptions   None    PDMP not reviewed this encounter.   Valinda Hoar, NP 08/10/21 1708

## 2021-08-08 NOTE — ED Triage Notes (Signed)
Pt c/o eye pain, cough, facial swelling, nasal congestion x5days ? ?Pt worried about a possible sinus infection.  ?

## 2021-09-02 DIAGNOSIS — E1165 Type 2 diabetes mellitus with hyperglycemia: Secondary | ICD-10-CM | POA: Diagnosis not present

## 2021-09-30 DIAGNOSIS — E1142 Type 2 diabetes mellitus with diabetic polyneuropathy: Secondary | ICD-10-CM | POA: Diagnosis not present

## 2021-09-30 DIAGNOSIS — R9431 Abnormal electrocardiogram [ECG] [EKG]: Secondary | ICD-10-CM | POA: Diagnosis not present

## 2021-09-30 DIAGNOSIS — E559 Vitamin D deficiency, unspecified: Secondary | ICD-10-CM | POA: Diagnosis not present

## 2021-09-30 DIAGNOSIS — I10 Essential (primary) hypertension: Secondary | ICD-10-CM | POA: Diagnosis not present

## 2021-09-30 DIAGNOSIS — M5440 Lumbago with sciatica, unspecified side: Secondary | ICD-10-CM | POA: Diagnosis not present

## 2021-09-30 DIAGNOSIS — E782 Mixed hyperlipidemia: Secondary | ICD-10-CM | POA: Diagnosis not present

## 2021-10-02 DIAGNOSIS — E1165 Type 2 diabetes mellitus with hyperglycemia: Secondary | ICD-10-CM | POA: Diagnosis not present

## 2021-10-04 DIAGNOSIS — Z03818 Encounter for observation for suspected exposure to other biological agents ruled out: Secondary | ICD-10-CM | POA: Diagnosis not present

## 2021-10-04 DIAGNOSIS — J019 Acute sinusitis, unspecified: Secondary | ICD-10-CM | POA: Diagnosis not present

## 2021-10-04 DIAGNOSIS — B9689 Other specified bacterial agents as the cause of diseases classified elsewhere: Secondary | ICD-10-CM | POA: Diagnosis not present

## 2021-10-07 DIAGNOSIS — E119 Type 2 diabetes mellitus without complications: Secondary | ICD-10-CM | POA: Diagnosis not present

## 2021-10-07 DIAGNOSIS — H43813 Vitreous degeneration, bilateral: Secondary | ICD-10-CM | POA: Diagnosis not present

## 2021-10-07 DIAGNOSIS — R9431 Abnormal electrocardiogram [ECG] [EKG]: Secondary | ICD-10-CM | POA: Diagnosis not present

## 2021-10-07 DIAGNOSIS — H2513 Age-related nuclear cataract, bilateral: Secondary | ICD-10-CM | POA: Diagnosis not present

## 2021-10-07 DIAGNOSIS — H35413 Lattice degeneration of retina, bilateral: Secondary | ICD-10-CM | POA: Diagnosis not present

## 2021-10-25 DIAGNOSIS — M1A0710 Idiopathic chronic gout, right ankle and foot, without tophus (tophi): Secondary | ICD-10-CM | POA: Diagnosis not present

## 2021-10-25 DIAGNOSIS — M85642 Other cyst of bone, left hand: Secondary | ICD-10-CM | POA: Diagnosis not present

## 2021-10-25 DIAGNOSIS — Z79899 Other long term (current) drug therapy: Secondary | ICD-10-CM | POA: Diagnosis not present

## 2021-10-25 DIAGNOSIS — M1812 Unilateral primary osteoarthritis of first carpometacarpal joint, left hand: Secondary | ICD-10-CM | POA: Diagnosis not present

## 2021-11-01 DIAGNOSIS — I10 Essential (primary) hypertension: Secondary | ICD-10-CM | POA: Diagnosis not present

## 2021-11-01 DIAGNOSIS — E1142 Type 2 diabetes mellitus with diabetic polyneuropathy: Secondary | ICD-10-CM | POA: Diagnosis not present

## 2021-11-01 DIAGNOSIS — E559 Vitamin D deficiency, unspecified: Secondary | ICD-10-CM | POA: Diagnosis not present

## 2021-11-01 DIAGNOSIS — E782 Mixed hyperlipidemia: Secondary | ICD-10-CM | POA: Diagnosis not present

## 2021-11-01 DIAGNOSIS — D519 Vitamin B12 deficiency anemia, unspecified: Secondary | ICD-10-CM | POA: Diagnosis not present

## 2021-11-01 DIAGNOSIS — E1165 Type 2 diabetes mellitus with hyperglycemia: Secondary | ICD-10-CM | POA: Diagnosis not present

## 2021-12-02 DIAGNOSIS — E1165 Type 2 diabetes mellitus with hyperglycemia: Secondary | ICD-10-CM | POA: Diagnosis not present

## 2021-12-31 DIAGNOSIS — M5440 Lumbago with sciatica, unspecified side: Secondary | ICD-10-CM | POA: Diagnosis not present

## 2021-12-31 DIAGNOSIS — I10 Essential (primary) hypertension: Secondary | ICD-10-CM | POA: Diagnosis not present

## 2021-12-31 DIAGNOSIS — E782 Mixed hyperlipidemia: Secondary | ICD-10-CM | POA: Diagnosis not present

## 2021-12-31 DIAGNOSIS — E559 Vitamin D deficiency, unspecified: Secondary | ICD-10-CM | POA: Diagnosis not present

## 2021-12-31 DIAGNOSIS — R5383 Other fatigue: Secondary | ICD-10-CM | POA: Diagnosis not present

## 2021-12-31 DIAGNOSIS — G4733 Obstructive sleep apnea (adult) (pediatric): Secondary | ICD-10-CM | POA: Diagnosis not present

## 2021-12-31 DIAGNOSIS — E1142 Type 2 diabetes mellitus with diabetic polyneuropathy: Secondary | ICD-10-CM | POA: Diagnosis not present

## 2021-12-31 DIAGNOSIS — F32A Depression, unspecified: Secondary | ICD-10-CM | POA: Diagnosis not present

## 2022-01-01 DIAGNOSIS — E1165 Type 2 diabetes mellitus with hyperglycemia: Secondary | ICD-10-CM | POA: Diagnosis not present

## 2022-01-06 DIAGNOSIS — M659 Synovitis and tenosynovitis, unspecified: Secondary | ICD-10-CM | POA: Insufficient documentation

## 2022-01-06 DIAGNOSIS — M65949 Unspecified synovitis and tenosynovitis, unspecified hand: Secondary | ICD-10-CM | POA: Insufficient documentation

## 2022-01-06 DIAGNOSIS — M189 Osteoarthritis of first carpometacarpal joint, unspecified: Secondary | ICD-10-CM | POA: Insufficient documentation

## 2022-01-06 DIAGNOSIS — M65849 Other synovitis and tenosynovitis, unspecified hand: Secondary | ICD-10-CM | POA: Diagnosis not present

## 2022-01-06 DIAGNOSIS — M79641 Pain in right hand: Secondary | ICD-10-CM

## 2022-01-06 DIAGNOSIS — R2232 Localized swelling, mass and lump, left upper limb: Secondary | ICD-10-CM | POA: Diagnosis not present

## 2022-01-06 DIAGNOSIS — M79642 Pain in left hand: Secondary | ICD-10-CM

## 2022-01-06 HISTORY — DX: Osteoarthritis of first carpometacarpal joint, unspecified: M18.9

## 2022-01-06 HISTORY — DX: Pain in right hand: M79.641

## 2022-01-06 HISTORY — DX: Pain in left hand: M79.642

## 2022-01-07 DIAGNOSIS — R2230 Localized swelling, mass and lump, unspecified upper limb: Secondary | ICD-10-CM | POA: Insufficient documentation

## 2022-01-07 DIAGNOSIS — M25642 Stiffness of left hand, not elsewhere classified: Secondary | ICD-10-CM | POA: Insufficient documentation

## 2022-01-20 DIAGNOSIS — Z4789 Encounter for other orthopedic aftercare: Secondary | ICD-10-CM | POA: Diagnosis not present

## 2022-02-03 DIAGNOSIS — M25642 Stiffness of left hand, not elsewhere classified: Secondary | ICD-10-CM | POA: Diagnosis not present

## 2022-02-03 DIAGNOSIS — R6889 Other general symptoms and signs: Secondary | ICD-10-CM | POA: Diagnosis not present

## 2022-02-03 HISTORY — DX: Stiffness of left hand, not elsewhere classified: M25.642

## 2022-02-24 DIAGNOSIS — M1A0710 Idiopathic chronic gout, right ankle and foot, without tophus (tophi): Secondary | ICD-10-CM | POA: Diagnosis not present

## 2022-02-24 DIAGNOSIS — Z79899 Other long term (current) drug therapy: Secondary | ICD-10-CM | POA: Diagnosis not present

## 2022-03-01 DIAGNOSIS — M5442 Lumbago with sciatica, left side: Secondary | ICD-10-CM | POA: Diagnosis not present

## 2022-03-01 DIAGNOSIS — E1165 Type 2 diabetes mellitus with hyperglycemia: Secondary | ICD-10-CM | POA: Diagnosis not present

## 2022-03-01 DIAGNOSIS — I10 Essential (primary) hypertension: Secondary | ICD-10-CM | POA: Diagnosis not present

## 2022-03-01 DIAGNOSIS — E782 Mixed hyperlipidemia: Secondary | ICD-10-CM | POA: Diagnosis not present

## 2022-03-01 DIAGNOSIS — M5441 Lumbago with sciatica, right side: Secondary | ICD-10-CM | POA: Diagnosis not present

## 2022-03-01 DIAGNOSIS — J069 Acute upper respiratory infection, unspecified: Secondary | ICD-10-CM | POA: Diagnosis not present

## 2022-06-19 ENCOUNTER — Other Ambulatory Visit: Payer: Self-pay | Admitting: Family

## 2022-07-05 ENCOUNTER — Other Ambulatory Visit: Payer: Self-pay | Admitting: Family

## 2022-07-30 ENCOUNTER — Other Ambulatory Visit: Payer: Self-pay | Admitting: Family

## 2022-09-02 ENCOUNTER — Encounter: Payer: Self-pay | Admitting: Family

## 2022-09-02 ENCOUNTER — Ambulatory Visit (INDEPENDENT_AMBULATORY_CARE_PROVIDER_SITE_OTHER): Payer: Medicare HMO | Admitting: Family

## 2022-09-02 VITALS — BP 138/74 | HR 66 | Ht 59.0 in | Wt 285.0 lb

## 2022-09-02 DIAGNOSIS — E1165 Type 2 diabetes mellitus with hyperglycemia: Secondary | ICD-10-CM | POA: Diagnosis not present

## 2022-09-02 DIAGNOSIS — E782 Mixed hyperlipidemia: Secondary | ICD-10-CM

## 2022-09-02 DIAGNOSIS — Z794 Long term (current) use of insulin: Secondary | ICD-10-CM | POA: Diagnosis not present

## 2022-09-02 DIAGNOSIS — E538 Deficiency of other specified B group vitamins: Secondary | ICD-10-CM

## 2022-09-02 DIAGNOSIS — I10 Essential (primary) hypertension: Secondary | ICD-10-CM

## 2022-09-02 DIAGNOSIS — E1142 Type 2 diabetes mellitus with diabetic polyneuropathy: Secondary | ICD-10-CM

## 2022-09-02 DIAGNOSIS — E559 Vitamin D deficiency, unspecified: Secondary | ICD-10-CM

## 2022-09-02 DIAGNOSIS — Z1389 Encounter for screening for other disorder: Secondary | ICD-10-CM

## 2022-09-02 DIAGNOSIS — R5383 Other fatigue: Secondary | ICD-10-CM

## 2022-09-02 LAB — POCT URINALYSIS DIPSTICK
Bilirubin, UA: NEGATIVE
Glucose, UA: NEGATIVE
Ketones, UA: NEGATIVE
Nitrite, UA: NEGATIVE
Protein, UA: POSITIVE — AB
Spec Grav, UA: 1.02 (ref 1.010–1.025)
Urobilinogen, UA: 0.2 E.U./dL
pH, UA: 5.5 (ref 5.0–8.0)

## 2022-09-02 LAB — POC CREATINE & ALBUMIN,URINE
Creatinine, POC: 300 mg/dL
Microalbumin Ur, POC: 80 mg/L

## 2022-09-02 LAB — POCT CBG (FASTING - GLUCOSE)-MANUAL ENTRY: Glucose Fasting, POC: 225 mg/dL — AB (ref 70–99)

## 2022-09-02 MED ORDER — FUROSEMIDE 40 MG PO TABS: 40.0000 mg | ORAL_TABLET | Freq: Every day | ORAL | 11 refills | Status: DC

## 2022-09-03 LAB — CMP14+EGFR
ALT: 23 IU/L (ref 0–32)
AST: 26 IU/L (ref 0–40)
Albumin/Globulin Ratio: 1.6 (ref 1.2–2.2)
Albumin: 4.2 g/dL (ref 3.9–4.9)
Alkaline Phosphatase: 124 IU/L — ABNORMAL HIGH (ref 44–121)
BUN/Creatinine Ratio: 16 (ref 12–28)
BUN: 13 mg/dL (ref 8–27)
Bilirubin Total: 0.4 mg/dL (ref 0.0–1.2)
CO2: 26 mmol/L (ref 20–29)
Calcium: 9.8 mg/dL (ref 8.7–10.3)
Chloride: 100 mmol/L (ref 96–106)
Creatinine, Ser: 0.82 mg/dL (ref 0.57–1.00)
Globulin, Total: 2.7 g/dL (ref 1.5–4.5)
Glucose: 202 mg/dL — ABNORMAL HIGH (ref 70–99)
Potassium: 4.2 mmol/L (ref 3.5–5.2)
Sodium: 141 mmol/L (ref 134–144)
Total Protein: 6.9 g/dL (ref 6.0–8.5)
eGFR: 79 mL/min/{1.73_m2} (ref >59)

## 2022-09-03 LAB — TSH: TSH: 1.4 u[IU]/mL (ref 0.450–4.500)

## 2022-09-03 LAB — CBC WITH DIFFERENTIAL
Basophils Absolute: 0 10*3/uL (ref 0.0–0.2)
Basos: 0 %
EOS (ABSOLUTE): 0.3 10*3/uL (ref 0.0–0.4)
Eos: 4 %
Hematocrit: 39.3 % (ref 34.0–46.6)
Hemoglobin: 13 g/dL (ref 11.1–15.9)
Immature Grans (Abs): 0 10*3/uL (ref 0.0–0.1)
Immature Granulocytes: 0 %
Lymphocytes Absolute: 1.3 10*3/uL (ref 0.7–3.1)
Lymphs: 18 %
MCH: 26.3 pg — ABNORMAL LOW (ref 26.6–33.0)
MCHC: 33.1 g/dL (ref 31.5–35.7)
MCV: 79 fL (ref 79–97)
Monocytes Absolute: 0.5 10*3/uL (ref 0.1–0.9)
Monocytes: 7 %
Neutrophils Absolute: 5.1 10*3/uL (ref 1.4–7.0)
Neutrophils: 71 %
RBC: 4.95 x10E6/uL (ref 3.77–5.28)
RDW: 14.7 % (ref 11.7–15.4)
WBC: 7.2 10*3/uL (ref 3.4–10.8)

## 2022-09-03 LAB — VITAMIN B12: Vitamin B-12: 612 pg/mL (ref 232–1245)

## 2022-09-03 LAB — LIPID PANEL
Chol/HDL Ratio: 2.5 ratio (ref 0.0–4.4)
Cholesterol, Total: 167 mg/dL (ref 100–199)
HDL: 66 mg/dL (ref >39)
LDL Chol Calc (NIH): 77 mg/dL (ref 0–99)
Triglycerides: 141 mg/dL (ref 0–149)
VLDL Cholesterol Cal: 24 mg/dL (ref 5–40)

## 2022-09-03 LAB — VITAMIN D 25 HYDROXY (VIT D DEFICIENCY, FRACTURES): Vit D, 25-Hydroxy: 35.8 ng/mL (ref 30.0–100.0)

## 2022-09-03 LAB — HEMOGLOBIN A1C
Est. average glucose Bld gHb Est-mCnc: 200 mg/dL
Hgb A1c MFr Bld: 8.6 % — ABNORMAL HIGH (ref 4.8–5.6)

## 2022-09-07 ENCOUNTER — Encounter: Payer: Self-pay | Admitting: Family

## 2022-09-07 NOTE — Progress Notes (Signed)
Established Patient Office Visit  Subjective:  Patient ID: April Harrison, female    DOB: Aug 27, 1955  Age: 67 y.o. MRN: [id number]  Chief Complaint  Patient presents with   Follow-up    Bilateral foot swelling    Patient is here today for swelling in her legs bilaterally, and she is back in the area so she needs to re-establish care.   She says that she was unable to continue taking her meds while she was in [place], says that they told her she could not take the tresiba and the mounjaro at the same time, but she was unable to get the [facility], so she has only been taking the [place].  She is having significant pain in her back, hips, legs, and feet.   No other concerns at this time.   Past Medical History:  Diagnosis Date   Anxiety    Arthritis    Depression    Diabetes mellitus    Type 2   Family history of adverse reaction to anesthesia    sister almost died   History of chicken pox    Hypertension    Measles    Mumps    Osteoarthritis of carpometacarpal (CMC) joint of thumb 01/06/2022   Pain in right hand 01/06/2022   Pain of left hand 01/06/2022   Pancreatitis    Sleep apnea    told that she doesn't have it now   Stiffness of finger joint of left hand 02/03/2022    Past Surgical History:  Procedure Laterality Date   CESAREAN SECTION     COLONOSCOPY WITH PROPOFOL N/A 07/21/2017   Procedure: COLONOSCOPY WITH PROPOFOL;  Surgeon: Toney Reil, MD;  Location: [facility] END[facility]COPY;  Service: Gastroenterology;  Laterality: N/A;   HARDWARE REMOVAL Left    knee   HYSTEROSCOPY WITH D & C N/A 08/29/2019   Procedure: DILATATION AND CURETTAGE /HYSTEROSCOPY & MYOSURE POYLPECTOMY AND MYOMECTOMY;  Surgeon: Vena Austria, MD;  Location: [facility] ORS;  Service: Gynecology;  Laterality: N/A;   KNEE SURGERY Left     Social History   Socioeconomic History   Marital status: Single    Spouse name: Not on file   Number of children: Not on file   Years of education: Not on file    Highest education level: Not on file  Occupational History   Not on file  Tobacco Use   Smoking status: Never   Smokeless tobacco: Never  Vaping Use   Vaping Use: Never used  Substance and Sexual Activity   Alcohol use: No   Drug use: No   Sexual activity: Not Currently    Birth control/protection: None  Other Topics Concern   Not on file  Social History Narrative   Not on file   Social Determinants of Health   Financial Resource Strain: Not on file  Food Insecurity: Not on file  Transportation Needs: Not on file  Physical Activity: Not on file  Stress: Not on file  Social Connections: Not on file  Intimate Partner Violence: Not on file    Family History  Problem Relation Age of Onset   Breast cancer Neg Hx     Allergies  Allergen Reactions   Penicillin G     Other reaction(s): Unknown   Gabapentin Rash    Review of Systems  Constitutional:  Positive for malaise/fatigue.  Cardiovascular:  Positive for leg swelling.  Musculoskeletal:  Positive for joint pain.  All other systems reviewed and are negative.  Objective:   BP 138/74   Pulse 66   Ht 4\' 11"  (1.499 m)   Wt 285 lb (129.3 kg)   SpO2 95%   BMI 57.56 kg/m   Vitals:   09/02/22 1258  BP: 138/74  Pulse: 66  Height: 4\' 11"  (1.499 m)  Weight: 285 lb (129.3 kg)  SpO2: 95%  BMI (Calculated): 57.53    Physical Exam Vitals and nursing note reviewed.  Constitutional:      Appearance: Normal appearance. She is normal weight.  HENT:     Head: Normocephalic.  Eyes:     Extraocular Movements: Extraocular movements intact.     Conjunctiva/sclera: Conjunctivae normal.     Pupils: Pupils are equal, round, and reactive to light.  Cardiovascular:     Rate and Rhythm: Normal rate.  Pulmonary:     Effort: Pulmonary effort is normal.  Musculoskeletal:     Right lower leg: 2+ Pitting Edema present.     Left lower leg: 2+ Pitting Edema present.  Neurological:     General: No focal deficit  present.     Mental Status: She is alert and oriented to person, place, and time. Mental status is at baseline.  Psychiatric:        Mood and Affect: Mood is anxious and depressed.        Behavior: Behavior normal. Behavior is cooperative.        Thought Content: Thought content normal.        Judgment: Judgment normal.      Results for orders placed or performed in visit on 09/02/22  Lipid panel  Result Value Ref Range   Cholesterol, Total 167 100 - 199 mg/dL   Triglycerides 409 0 - 149 mg/dL   HDL 66 >81 mg/dL   VLDL Cholesterol Cal 24 5 - 40 mg/dL   LDL Chol Calc (NIH) 77 0 - 99 mg/dL   Chol/HDL Ratio 2.5 0.0 - 4.4 ratio  VITAMIN D 25 Hydroxy (Vit-D Deficiency, Fractures)  Result Value Ref Range   Vit D, 25-Hydroxy 35.8 30.0 - 100.0 ng/mL  CBC With Differential  Result Value Ref Range   WBC 7.2 3.4 - 10.8 x10E3/uL   RBC 4.95 3.77 - 5.28 x10E6/uL   Hemoglobin 13.0 11.1 - 15.9 g/dL   Hematocrit 19.1 47.8 - 46.6 %   MCV 79 79 - 97 fL   MCH 26.3 (L) 26.6 - 33.0 pg   MCHC 33.1 31.5 - 35.7 g/dL   RDW 29.5 62.1 - 30.8 %   Neutrophils 71 Not Estab. %   Lymphs 18 Not Estab. %   Monocytes 7 Not Estab. %   Eos 4 Not Estab. %   Basos 0 Not Estab. %   Neutrophils Absolute 5.1 1.4 - 7.0 x10E3/uL   Lymphocytes Absolute 1.3 0.7 - 3.1 x10E3/uL   Monocytes Absolute 0.5 0.1 - 0.9 x10E3/uL   EOS (ABSOLUTE) 0.3 0.0 - 0.4 x10E3/uL   Basophils Absolute 0.0 0.0 - 0.2 x10E3/uL   Immature Granulocytes 0 Not Estab. %   Immature Grans (Abs) 0.0 0.0 - 0.1 x10E3/uL  CMP14+EGFR  Result Value Ref Range   Glucose 202 (H) 70 - 99 mg/dL   BUN 13 8 - 27 mg/dL   Creatinine, Ser 6.57 0.57 - 1.00 mg/dL   eGFR 79 >84 ON/GEX/5.28   BUN/Creatinine Ratio 16 12 - 28   Sodium 141 134 - 144 mmol/L   Potassium 4.2 3.5 - 5.2 mmol/L   Chloride 100 96 - 106 mmol/L  CO2 26 20 - 29 mmol/L   Calcium 9.8 8.7 - 10.3 mg/dL   Total Protein 6.9 6.0 - 8.5 g/dL   Albumin 4.2 3.9 - 4.9 g/dL   Globulin, Total 2.7  1.5 - 4.5 g/dL   Albumin/Globulin Ratio 1.6 1.2 - 2.2   Bilirubin Total 0.4 0.0 - 1.2 mg/dL   Alkaline Phosphatase 124 (H) 44 - 121 IU/L   AST 26 0 - 40 IU/L   ALT 23 0 - 32 IU/L  TSH  Result Value Ref Range   TSH 1.400 0.450 - 4.500 uIU/mL  Hemoglobin A1c  Result Value Ref Range   Hgb A1c MFr Bld 8.6 (H) 4.8 - 5.6 %   Est. average glucose Bld gHb Est-mCnc 200 mg/dL  Vitamin Z61  Result Value Ref Range   Vitamin B-12 612 232 - 1,245 pg/mL  POCT CBG (Fasting - Glucose)  Result Value Ref Range   Glucose Fasting, POC 225 (A) 70 - 99 mg/dL  POCT Urinalysis Dipstick (81002)  Result Value Ref Range   Color, UA     Clarity, UA     Glucose, UA Negative Negative   Bilirubin, UA Negative    Ketones, UA Negative    Spec Grav, UA 1.020 1.010 - 1.025   Blood, UA Trace-intact    pH, UA 5.5 5.0 - 8.0   Protein, UA Positive (A) Negative   Urobilinogen, UA 0.2 0.2 or 1.0 E.U./dL   Nitrite, UA Negative    Leukocytes, UA Small (1+) (A) Negative   Appearance     Odor    POC CREATINE & ALBUMIN,URINE  Result Value Ref Range   Microalbumin Ur, POC 80 mg/L   Creatinine, POC 300 mg/dL   Albumin/Creatinine Ratio, Urine, POC 30-300     Recent Results (from the past 2160 hour(s))  POCT CBG (Fasting - Glucose)     Status: Abnormal   Collection Time: 09/02/22  1:08 PM  Result Value Ref Range   Glucose Fasting, POC 225 (A) 70 - 99 mg/dL  POC CREATINE & ALBUMIN,URINE     Status: Abnormal   Collection Time: 09/02/22  2:28 PM  Result Value Ref Range   Microalbumin Ur, POC 80 mg/L   Creatinine, POC 300 mg/dL   Albumin/Creatinine Ratio, Urine, POC 30-300   POCT Urinalysis Dipstick (09604)     Status: Abnormal   Collection Time: 09/02/22  2:30 PM  Result Value Ref Range   Color, UA     Clarity, UA     Glucose, UA Negative Negative   Bilirubin, UA Negative    Ketones, UA Negative    Spec Grav, UA 1.020 1.010 - 1.025   Blood, UA Trace-intact    pH, UA 5.5 5.0 - 8.0   Protein, UA Positive (A)  Negative    Comment: 30mg /dl   Urobilinogen, UA 0.2 0.2 or 1.0 E.U./dL   Nitrite, UA Negative    Leukocytes, UA Small (1+) (A) Negative   Appearance     Odor    Lipid panel     Status: None   Collection Time: 09/02/22  3:17 PM  Result Value Ref Range   Cholesterol, Total 167 100 - 199 mg/dL   Triglycerides 540 0 - 149 mg/dL   HDL 66 >98 mg/dL   VLDL Cholesterol Cal 24 5 - 40 mg/dL   LDL Chol Calc (NIH) 77 0 - 99 mg/dL   Chol/HDL Ratio 2.5 0.0 - 4.4 ratio    Comment:  T. Chol/HDL Ratio                                             Men  Women                               1/2 Avg.Risk  3.4    3.3                                   Avg.Risk  5.0    4.4                                2X Avg.Risk  9.6    7.1                                3X Avg.Risk 23.4   11.0   VITAMIN D 25 Hydroxy (Vit-D Deficiency, Fractures)     Status: None   Collection Time: 09/02/22  3:17 PM  Result Value Ref Range   Vit D, 25-Hydroxy 35.8 30.0 - 100.0 ng/mL    Comment: Vitamin D deficiency has been defined by the Institute of Medicine and an Endocrine Society practice guideline as a level of serum 25-OH vitamin D less than 20 ng/mL (1,2). The Endocrine Society went on to further define vitamin D insufficiency as a level between 21 and 29 ng/mL (2). 1. IOM (Institute of Medicine). 2010. Dietary reference    intakes for calcium and D. [place]: The    [employer]. 2. Holick MF, Binkley , Bischoff-Ferrari HA, et al.    Evaluation, treatment, and prevention of vitamin D    deficiency: an Endocrine Society clinical practice    guideline. JCEM. 2011 Jul; 96(7):1911-30.   CBC With Differential     Status: Abnormal   Collection Time: 09/02/22  3:17 PM  Result Value Ref Range   WBC 7.2 3.4 - 10.8 x10E3/uL   RBC 4.95 3.77 - 5.28 x10E6/uL   Hemoglobin 13.0 11.1 - 15.9 g/dL   Hematocrit 16.1 09.6 - 46.6 %   MCV 79 79 - 97 fL   MCH 26.3 (L) 26.6 - 33.0 pg    MCHC 33.1 31.5 - 35.7 g/dL   RDW 04.5 40.9 - 81.1 %   Neutrophils 71 Not Estab. %   Lymphs 18 Not Estab. %   Monocytes 7 Not Estab. %   Eos 4 Not Estab. %   Basos 0 Not Estab. %   Neutrophils Absolute 5.1 1.4 - 7.0 x10E3/uL   Lymphocytes Absolute 1.3 0.7 - 3.1 x10E3/uL   Monocytes Absolute 0.5 0.1 - 0.9 x10E3/uL   EOS (ABSOLUTE) 0.3 0.0 - 0.4 x10E3/uL   Basophils Absolute 0.0 0.0 - 0.2 x10E3/uL   Immature Granulocytes 0 Not Estab. %   Immature Grans (Abs) 0.0 0.0 - 0.1 x10E3/uL  CMP14+EGFR     Status: Abnormal   Collection Time: 09/02/22  3:17 PM  Result Value Ref Range   Glucose 202 (H) 70 - 99 mg/dL   BUN 13 8 - 27 mg/dL   Creatinine, Ser 9.14 0.57 - 1.00 mg/dL   eGFR 79 >78 GN/FAO/1.30  BUN/Creatinine Ratio 16 12 - 28   Sodium 141 134 - 144 mmol/L   Potassium 4.2 3.5 - 5.2 mmol/L   Chloride 100 96 - 106 mmol/L   CO2 26 20 - 29 mmol/L   Calcium 9.8 8.7 - 10.3 mg/dL   Total Protein 6.9 6.0 - 8.5 g/dL   Albumin 4.2 3.9 - 4.9 g/dL   Globulin, Total 2.7 1.5 - 4.5 g/dL   Albumin/Globulin Ratio 1.6 1.2 - 2.2   Bilirubin Total 0.4 0.0 - 1.2 mg/dL   Alkaline Phosphatase 124 (H) 44 - 121 IU/L   AST 26 0 - 40 IU/L   ALT 23 0 - 32 IU/L  TSH     Status: None   Collection Time: 09/02/22  3:17 PM  Result Value Ref Range   TSH 1.400 0.450 - 4.500 uIU/mL  Hemoglobin A1c     Status: Abnormal   Collection Time: 09/02/22  3:17 PM  Result Value Ref Range   Hgb A1c MFr Bld 8.6 (H) 4.8 - 5.6 %    Comment:          Prediabetes: 5.7 - 6.4          Diabetes: >6.4          Glycemic control for adults with diabetes: <7.0    Est. average glucose Bld gHb Est-mCnc 200 mg/dL  Vitamin Z61     Status: None   Collection Time: 09/02/22  3:17 PM  Result Value Ref Range   Vitamin B-12 612 232 - 1,245 pg/mL       Assessment & Plan:   Problem List Items Addressed This Visit       Endocrine   Diabetes mellitus (HCC) - Primary   Relevant Orders   POC CREATINE & ALBUMIN,URINE (Completed)    CBC With Differential (Completed)   CMP14+EGFR (Completed)   POCT CBG (Fasting - Glucose) (Completed)   POC CREATINE & ALBUMIN,URINE (Completed)   CBC With Differential (Completed)   CMP14+EGFR (Completed)   Hemoglobin A1c (Completed)     Other   Hyperlipidemia   Relevant Medications   furosemide (LASIX) 40 MG tablet   Other Relevant Orders   Lipid panel (Completed)   CBC With Differential (Completed)   CMP14+EGFR (Completed)   Other Visit Diagnoses     Screening for blood or protein in urine       Relevant Orders   POCT Urinalysis Dipstick (81002) (Completed)   CBC With Differential (Completed)   CMP14+EGFR (Completed)   Other fatigue       Relevant Orders   CBC With Differential (Completed)   CMP14+EGFR (Completed)   TSH (Completed)   B12 deficiency due to diet       Relevant Orders   CBC With Differential (Completed)   CMP14+EGFR (Completed)   Vitamin B12 (Completed)   Vitamin D deficiency, unspecified       Relevant Orders   VITAMIN D 25 Hydroxy (Vit-D Deficiency, Fractures) (Completed)   CBC With Differential (Completed)   CMP14+EGFR (Completed)   Essential hypertension, benign       Relevant Medications   furosemide (LASIX) 40 MG tablet   Other Relevant Orders   CBC With Differential (Completed)   CMP14+EGFR (Completed)       Return in about 2 weeks (around 09/16/2022) for F/U.   Total time spent: 30 minutes  Miki Kins, FNP  09/02/2022

## 2022-09-16 ENCOUNTER — Ambulatory Visit (INDEPENDENT_AMBULATORY_CARE_PROVIDER_SITE_OTHER): Payer: Medicare HMO | Admitting: Family

## 2022-09-16 ENCOUNTER — Other Ambulatory Visit: Payer: Self-pay

## 2022-09-16 VITALS — BP 128/62 | HR 77 | Ht 59.0 in | Wt 268.0 lb

## 2022-09-16 DIAGNOSIS — E1142 Type 2 diabetes mellitus with diabetic polyneuropathy: Secondary | ICD-10-CM

## 2022-09-16 DIAGNOSIS — M51369 Other intervertebral disc degeneration, lumbar region without mention of lumbar back pain or lower extremity pain: Secondary | ICD-10-CM

## 2022-09-16 DIAGNOSIS — E1165 Type 2 diabetes mellitus with hyperglycemia: Secondary | ICD-10-CM | POA: Diagnosis not present

## 2022-09-16 DIAGNOSIS — R6889 Other general symptoms and signs: Secondary | ICD-10-CM | POA: Diagnosis not present

## 2022-09-16 DIAGNOSIS — M5136 Other intervertebral disc degeneration, lumbar region: Secondary | ICD-10-CM | POA: Diagnosis not present

## 2022-09-16 LAB — POCT CBG (FASTING - GLUCOSE)-MANUAL ENTRY: Glucose Fasting, POC: 245 mg/dL — AB (ref 70–99)

## 2022-09-16 MED ORDER — CARIPRAZINE HCL 1.5 MG PO CAPS: 1.5000 mg | ORAL_CAPSULE | Freq: Every day | ORAL | 3 refills | Status: DC

## 2022-09-16 MED ORDER — MOUNJARO 15 MG/0.5ML ~~LOC~~ SOAJ
15.0000 mg | SUBCUTANEOUS | 2 refills | Status: DC
  Filled 2022-09-16 – 2022-09-26 (×2): qty 2, 28d supply, fill #0
  Filled 2022-10-31: qty 2, 28d supply, fill #1

## 2022-09-16 MED ORDER — SERTRALINE HCL 100 MG PO TABS: 150.0000 mg | ORAL_TABLET | Freq: Every day | ORAL | 1 refills | Status: DC

## 2022-09-16 NOTE — Progress Notes (Signed)
Established Patient Office Visit  Subjective:  Patient ID: April Harrison, female    DOB: Aug 20, 1955  Age: 67 y.o. MRN: [id number]  Chief Complaint  Patient presents with   Follow-up    2 week follow up    Patient is here today for 2 week follow up.  She has been feeling much better today, but she does endorse an increase in her depression symptoms. She says that she doesn't think the zoloft is helping as much anymore, especially since she doesn't have a place or a car.   Had her labs drawn at previous appointment:  Her A1C has gone back up significantly - she has not had her Mounjaro in a few weeks.   Needs to see someone about her back/hip/knee pain.   No other concerns at this time.   Past Medical History:  Diagnosis Date   Anxiety    Arthritis    Depression    Diabetes mellitus    Type 2   Family history of adverse reaction to anesthesia    sister almost died   History of chicken pox    Hypertension    Measles    Mumps    Osteoarthritis of carpometacarpal (CMC) joint of thumb 01/06/2022   Pain in right hand 01/06/2022   Pain of left hand 01/06/2022   Pancreatitis    Sleep apnea    told that she doesn't have it now   Stiffness of finger joint of left hand 02/03/2022    Past Surgical History:  Procedure Laterality Date   CESAREAN SECTION     COLONOSCOPY WITH PROPOFOL N/A 07/21/2017   Procedure: COLONOSCOPY WITH PROPOFOL;  Surgeon: Toney Reil, MD;  Location: [facility];  Service: Gastroenterology;  Laterality: N/A;   HARDWARE REMOVAL Left    knee   HYSTEROSCOPY WITH D & C N/A 08/29/2019   Procedure: DILATATION AND CURETTAGE /HYSTEROSCOPY & MYOSURE POYLPECTOMY AND MYOMECTOMY;  Surgeon: Vena Austria, MD;  Location: [facility] ORS;  Service: Gynecology;  Laterality: N/A;   KNEE SURGERY Left     Social History   Socioeconomic History   Marital status: Single    Spouse name: Not on file   Number of children: Not on file   Years of education: Not on  file   Highest education level: Not on file  Occupational History   Not on file  Tobacco Use   Smoking status: Never   Smokeless tobacco: Never  Vaping Use   Vaping Use: Never used  Substance and Sexual Activity   Alcohol use: No   Drug use: No   Sexual activity: Not Currently    Birth control/protection: None  Other Topics Concern   Not on file  Social History Narrative   Not on file   Social Determinants of Health   Financial Resource Strain: Not on file  Food Insecurity: Not on file  Transportation Needs: Not on file  Physical Activity: Not on file  Stress: Not on file  Social Connections: Not on file  Intimate Partner Violence: Not on file    Family History  Problem Relation Age of Onset   Breast cancer Neg Hx     Allergies  Allergen Reactions   Penicillin G     Other reaction(s): Unknown   Gabapentin Rash    Review of Systems  Constitutional:  Positive for malaise/fatigue.  Musculoskeletal:  Positive for back pain, joint pain and myalgias.  Psychiatric/Behavioral:  Positive for depression.   All other systems reviewed and  are negative.      Objective:   BP 128/62   Pulse 77   Ht 4\' 11"  (1.499 m)   Wt 268 lb (121.6 kg)   SpO2 98%   BMI 54.13 kg/m   Vitals:   09/16/22 1259  BP: 128/62  Pulse: 77  Height: 4\' 11"  (1.499 m)  Weight: 268 lb (121.6 kg)  SpO2: 98%  BMI (Calculated): 54.1    Physical Exam Vitals and nursing note reviewed.  Constitutional:      Appearance: Normal appearance. She is normal weight.  HENT:     Head: Normocephalic.  Eyes:     Pupils: Pupils are equal, round, and reactive to light.  Cardiovascular:     Rate and Rhythm: Normal rate.  Pulmonary:     Effort: Pulmonary effort is normal.  Musculoskeletal:     Lumbar back: Spasms and tenderness present. Decreased range of motion. Positive right straight leg raise test and positive left straight leg raise test.     Right hip: Tenderness present.     Left hip:  Tenderness present.     Right lower leg: Swelling present. 1+ Pitting Edema present.     Left lower leg: Swelling present. 1+ Pitting Edema present.  Neurological:     Mental Status: She is alert.      Results for orders placed or performed in visit on 09/16/22  POCT CBG (Fasting - Glucose)  Result Value Ref Range   Glucose Fasting, POC 245 (A) 70 - 99 mg/dL    Recent Results (from the past 2160 hour(s))  POCT CBG (Fasting - Glucose)     Status: Abnormal   Collection Time: 09/02/22  1:08 PM  Result Value Ref Range   Glucose Fasting, POC 225 (A) 70 - 99 mg/dL  POC CREATINE & ALBUMIN,URINE     Status: Abnormal   Collection Time: 09/02/22  2:28 PM  Result Value Ref Range   Microalbumin Ur, POC 80 mg/L   Creatinine, POC 300 mg/dL   Albumin/Creatinine Ratio, Urine, POC 30-300   POCT Urinalysis Dipstick (63875)     Status: Abnormal   Collection Time: 09/02/22  2:30 PM  Result Value Ref Range   Color, UA     Clarity, UA     Glucose, UA Negative Negative   Bilirubin, UA Negative    Ketones, UA Negative    Spec Grav, UA 1.020 1.010 - 1.025   Blood, UA Trace-intact    pH, UA 5.5 5.0 - 8.0   Protein, UA Positive (A) Negative    Comment: 30mg /dl   Urobilinogen, UA 0.2 0.2 or 1.0 E.U./dL   Nitrite, UA Negative    Leukocytes, UA Small (1+) (A) Negative   Appearance     Odor    Lipid panel     Status: None   Collection Time: 09/02/22  3:17 PM  Result Value Ref Range   Cholesterol, Total 167 100 - 199 mg/dL   Triglycerides 643 0 - 149 mg/dL   HDL 66 >32 mg/dL   VLDL Cholesterol Cal 24 5 - 40 mg/dL   LDL Chol Calc (NIH) 77 0 - 99 mg/dL   Chol/HDL Ratio 2.5 0.0 - 4.4 ratio    Comment:                                   T. Chol/HDL Ratio  Men  Women                               1/2 Avg.Risk  3.4    3.3                                   Avg.Risk  5.0    4.4                                2X Avg.Risk  9.6    7.1                                 3X Avg.Risk 23.4   11.0   VITAMIN D 25 Hydroxy (Vit-D Deficiency, Fractures)     Status: None   Collection Time: 09/02/22  3:17 PM  Result Value Ref Range   Vit D, 25-Hydroxy 35.8 30.0 - 100.0 ng/mL    Comment: Vitamin D deficiency has been defined by the Institute of Medicine and an Endocrine Society practice guideline as a level of serum 25-OH vitamin D less than 20 ng/mL (1,2). The Endocrine Society went on to further define vitamin D insufficiency as a level between 21 and 29 ng/mL (2). 1. IOM (Institute of Medicine). 2010. Dietary reference    intakes for calcium and D. [place]: The    [employer]. 2. Holick MF, Binkley [place], Bischoff-Ferrari HA, et al.    Evaluation, treatment, and prevention of vitamin D    deficiency: an Endocrine Society clinical practice    guideline. JCEM. 2011 Jul; 96(7):1911-30.   CBC With Differential     Status: Abnormal   Collection Time: 09/02/22  3:17 PM  Result Value Ref Range   WBC 7.2 3.4 - 10.8 x10E3/uL   RBC 4.95 3.77 - 5.28 x10E6/uL   Hemoglobin 13.0 11.1 - 15.9 g/dL   Hematocrit 56.4 33.2 - 46.6 %   MCV 79 79 - 97 fL   MCH 26.3 (L) 26.6 - 33.0 pg   MCHC 33.1 31.5 - 35.7 g/dL   RDW 95.1 88.4 - 16.6 %   Neutrophils 71 Not Estab. %   Lymphs 18 Not Estab. %   Monocytes 7 Not Estab. %   Eos 4 Not Estab. %   Basos 0 Not Estab. %   Neutrophils Absolute 5.1 1.4 - 7.0 x10E3/uL   Lymphocytes Absolute 1.3 0.7 - 3.1 x10E3/uL   Monocytes Absolute 0.5 0.1 - 0.9 x10E3/uL   EOS (ABSOLUTE) 0.3 0.0 - 0.4 x10E3/uL   Basophils Absolute 0.0 0.0 - 0.2 x10E3/uL   Immature Granulocytes 0 Not Estab. %   Immature Grans (Abs) 0.0 0.0 - 0.1 x10E3/uL  CMP14+EGFR     Status: Abnormal   Collection Time: 09/02/22  3:17 PM  Result Value Ref Range   Glucose 202 (H) 70 - 99 mg/dL   BUN 13 8 - 27 mg/dL   Creatinine, Ser 0.63 0.57 - 1.00 mg/dL   eGFR 79 >01 SW/FUX/3.23   BUN/Creatinine Ratio 16 12 - 28   Sodium 141 134 - 144 mmol/L    Potassium 4.2 3.5 - 5.2 mmol/L   Chloride 100 96 - 106 mmol/L   CO2 26 20 - 29 mmol/L   Calcium 9.8 8.7 - 10.3  mg/dL   Total Protein 6.9 6.0 - 8.5 g/dL   Albumin 4.2 3.9 - 4.9 g/dL   Globulin, Total 2.7 1.5 - 4.5 g/dL   Albumin/Globulin Ratio 1.6 1.2 - 2.2   Bilirubin Total 0.4 0.0 - 1.2 mg/dL   Alkaline Phosphatase 124 (H) 44 - 121 IU/L   AST 26 0 - 40 IU/L   ALT 23 0 - 32 IU/L  TSH     Status: None   Collection Time: 09/02/22  3:17 PM  Result Value Ref Range   TSH 1.400 0.450 - 4.500 uIU/mL  Hemoglobin A1c     Status: Abnormal   Collection Time: 09/02/22  3:17 PM  Result Value Ref Range   Hgb A1c MFr Bld 8.6 (H) 4.8 - 5.6 %    Comment:          Prediabetes: 5.7 - 6.4          Diabetes: >6.4          Glycemic control for adults with diabetes: <7.0    Est. average glucose Bld gHb Est-mCnc 200 mg/dL  Vitamin Z61     Status: None   Collection Time: 09/02/22  3:17 PM  Result Value Ref Range   Vitamin B-12 612 232 - 1,245 pg/mL  POCT CBG (Fasting - Glucose)     Status: Abnormal   Collection Time: 09/16/22  1:04 PM  Result Value Ref Range   Glucose Fasting, POC 245 (A) 70 - 99 mg/dL       Assessment & Plan:   Problem List Items Addressed This Visit       Active Problems   Morbid obesity (HCC)   Relevant Medications   tirzepatide (MOUNJARO) 15 MG/0.5ML Pen   Diabetes mellitus (HCC) - Primary   Relevant Medications   tirzepatide (MOUNJARO) 15 MG/0.5ML Pen   Other Relevant Orders   POCT CBG (Fasting - Glucose) (Completed)   Degeneration of lumbar intervertebral disc   Relevant Orders   Ambulatory referral to Physical Medicine Rehab   Other Visit Diagnoses     Type 2 diabetes mellitus with peripheral neuropathy (HCC)   (Chronic)     Relevant Medications   tirzepatide (MOUNJARO) 15 MG/0.5ML Pen   cariprazine (VRAYLAR) 1.5 MG capsule   sertraline (ZOLOFT) 100 MG tablet       Return in about 3 months (around 12/17/2022) for F/U.   Total time spent: 30  minutes  Miki Kins, FNP  09/16/2022

## 2022-09-19 ENCOUNTER — Encounter: Payer: Self-pay | Admitting: Family

## 2022-09-20 ENCOUNTER — Telehealth: Payer: Self-pay

## 2022-09-20 NOTE — Telephone Encounter (Signed)
Pt called and left vm regarding [employer] paperwork, said she can't use the ones signed by you said they have to be signed with Neelam, I think I found the paperwork in my email I will print & put on your desk. Please advise

## 2022-09-26 ENCOUNTER — Other Ambulatory Visit: Payer: Self-pay

## 2022-09-30 ENCOUNTER — Telehealth: Payer: Self-pay

## 2022-09-30 NOTE — Telephone Encounter (Signed)
Patient called requesting hard copy of paperwork for disability for school. Coming to pick it up.

## 2022-10-06 DIAGNOSIS — M1A0710 Idiopathic chronic gout, right ankle and foot, without tophus (tophi): Secondary | ICD-10-CM | POA: Diagnosis not present

## 2022-10-06 DIAGNOSIS — Z79899 Other long term (current) drug therapy: Secondary | ICD-10-CM | POA: Diagnosis not present

## 2022-10-13 DIAGNOSIS — M545 Low back pain, unspecified: Secondary | ICD-10-CM | POA: Diagnosis not present

## 2022-10-13 DIAGNOSIS — M5136 Other intervertebral disc degeneration, lumbar region: Secondary | ICD-10-CM | POA: Diagnosis not present

## 2022-10-13 DIAGNOSIS — R6889 Other general symptoms and signs: Secondary | ICD-10-CM | POA: Diagnosis not present

## 2022-10-13 DIAGNOSIS — M5416 Radiculopathy, lumbar region: Secondary | ICD-10-CM | POA: Diagnosis not present

## 2022-10-20 ENCOUNTER — Telehealth: Payer: Self-pay | Admitting: Family

## 2022-10-20 NOTE — Telephone Encounter (Signed)
Jasmine with [employer] left VM that they need the form for the [facility] 2 supplies corrected by having a date by the correction on the form.  Fax [phone] Phone [phone]

## 2022-10-26 DIAGNOSIS — E1165 Type 2 diabetes mellitus with hyperglycemia: Secondary | ICD-10-CM | POA: Diagnosis not present

## 2022-10-27 ENCOUNTER — Telehealth: Payer: Self-pay | Admitting: Family

## 2022-10-27 NOTE — Telephone Encounter (Signed)
Good afternoon! Please be advised that we have received and reviewed all documentation for the [facility] 3 order. However, before we can submit for final approval, there is additional information we need from the patient. Please ask patient to call our office at [phone] as soon as possible as we cannot move forward

## 2022-10-31 ENCOUNTER — Other Ambulatory Visit: Payer: Self-pay

## 2022-11-03 ENCOUNTER — Other Ambulatory Visit: Payer: Self-pay

## 2022-11-14 ENCOUNTER — Other Ambulatory Visit: Payer: Self-pay

## 2022-12-19 ENCOUNTER — Ambulatory Visit: Payer: Medicare HMO | Admitting: Family

## 2022-12-19 ENCOUNTER — Other Ambulatory Visit: Payer: Self-pay

## 2022-12-19 MED ORDER — MOUNJARO 15 MG/0.5ML ~~LOC~~ SOAJ: 15.0000 mg | SUBCUTANEOUS | 2 refills | Status: DC

## 2022-12-22 ENCOUNTER — Other Ambulatory Visit: Payer: Self-pay

## 2023-01-03 ENCOUNTER — Other Ambulatory Visit: Payer: Self-pay

## 2023-01-03 MED ORDER — MOUNJARO 15 MG/0.5ML ~~LOC~~ SOAJ: 15.0000 mg | SUBCUTANEOUS | 2 refills | Status: DC

## 2023-01-04 ENCOUNTER — Other Ambulatory Visit: Payer: Self-pay

## 2023-01-04 MED ORDER — SERTRALINE HCL 100 MG PO TABS: 150.0000 mg | ORAL_TABLET | Freq: Every day | ORAL | 1 refills | Status: DC

## 2023-02-14 ENCOUNTER — Encounter: Payer: Self-pay | Admitting: Family

## 2023-02-24 ENCOUNTER — Encounter: Payer: Self-pay | Admitting: Family

## 2023-02-24 ENCOUNTER — Ambulatory Visit (INDEPENDENT_AMBULATORY_CARE_PROVIDER_SITE_OTHER): Payer: Medicare HMO | Admitting: Family

## 2023-02-24 VITALS — BP 110/76 | HR 59 | Ht 59.0 in | Wt 256.6 lb

## 2023-02-24 DIAGNOSIS — R4182 Altered mental status, unspecified: Secondary | ICD-10-CM

## 2023-02-24 DIAGNOSIS — R413 Other amnesia: Secondary | ICD-10-CM

## 2023-02-24 DIAGNOSIS — E538 Deficiency of other specified B group vitamins: Secondary | ICD-10-CM | POA: Diagnosis not present

## 2023-02-24 DIAGNOSIS — E782 Mixed hyperlipidemia: Secondary | ICD-10-CM | POA: Diagnosis not present

## 2023-02-24 DIAGNOSIS — R29818 Other symptoms and signs involving the nervous system: Secondary | ICD-10-CM

## 2023-02-24 DIAGNOSIS — E1142 Type 2 diabetes mellitus with diabetic polyneuropathy: Secondary | ICD-10-CM

## 2023-02-24 DIAGNOSIS — I10 Essential (primary) hypertension: Secondary | ICD-10-CM

## 2023-02-24 DIAGNOSIS — Z794 Long term (current) use of insulin: Secondary | ICD-10-CM | POA: Diagnosis not present

## 2023-02-24 DIAGNOSIS — E559 Vitamin D deficiency, unspecified: Secondary | ICD-10-CM
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
## 2023-02-25 LAB — LIPID PANEL
Chol/HDL Ratio: 2.7 {ratio} (ref 0.0–4.4)
Cholesterol, Total: 183 mg/dL (ref 100–199)
HDL: 69 mg/dL (ref >39)
LDL Chol Calc (NIH): 91 mg/dL (ref 0–99)
Triglycerides: 133 mg/dL (ref 0–149)
VLDL Cholesterol Cal: 23 mg/dL (ref 5–40)

## 2023-02-25 LAB — CBC WITH DIFFERENTIAL/PLATELET
Basophils Absolute: 0.1 10*3/uL (ref 0.0–0.2)
Basos: 1 %
EOS (ABSOLUTE): 0.3 10*3/uL (ref 0.0–0.4)
Eos: 3 %
Hematocrit: 44 % (ref 34.0–46.6)
Hemoglobin: 14.2 g/dL (ref 11.1–15.9)
Immature Grans (Abs): 0 10*3/uL (ref 0.0–0.1)
Immature Granulocytes: 0 %
Lymphocytes Absolute: 1.2 10*3/uL (ref 0.7–3.1)
Lymphs: 13 %
MCH: 25.8 pg — ABNORMAL LOW (ref 26.6–33.0)
MCHC: 32.3 g/dL (ref 31.5–35.7)
MCV: 80 fL (ref 79–97)
Monocytes Absolute: 0.7 10*3/uL (ref 0.1–0.9)
Monocytes: 7 %
Neutrophils Absolute: 7.1 10*3/uL — ABNORMAL HIGH (ref 1.4–7.0)
Neutrophils: 76 %
Platelets: 182 10*3/uL (ref 150–450)
RBC: 5.5 x10E6/uL — ABNORMAL HIGH (ref 3.77–5.28)
RDW: 15.1 % (ref 11.7–15.4)
WBC: 9.4 10*3/uL (ref 3.4–10.8)

## 2023-02-25 LAB — VITAMIN D 25 HYDROXY (VIT D DEFICIENCY, FRACTURES): Vit D, 25-Hydroxy: 47.6 ng/mL (ref 30.0–100.0)

## 2023-02-25 LAB — CMP14+EGFR
ALT: 22 [IU]/L (ref 0–32)
AST: 31 [IU]/L (ref 0–40)
Albumin: 4.5 g/dL (ref 3.9–4.9)
Alkaline Phosphatase: 97 [IU]/L (ref 44–121)
BUN/Creatinine Ratio: 20 (ref 12–28)
BUN: 17 mg/dL (ref 8–27)
Bilirubin Total: 0.4 mg/dL (ref 0.0–1.2)
CO2: 27 mmol/L (ref 20–29)
Calcium: 9.8 mg/dL (ref 8.7–10.3)
Chloride: 102 mmol/L (ref 96–106)
Creatinine, Ser: 0.85 mg/dL (ref 0.57–1.00)
Globulin, Total: 2.9 g/dL (ref 1.5–4.5)
Glucose: 79 mg/dL (ref 70–99)
Potassium: 4.1 mmol/L (ref 3.5–5.2)
Sodium: 144 mmol/L (ref 134–144)
Total Protein: 7.4 g/dL (ref 6.0–8.5)
eGFR: 76 mL/min/{1.73_m2} (ref >59)

## 2023-02-25 LAB — VITAMIN B12: Vitamin B-12: 599 pg/mL (ref 232–1245)

## 2023-02-25 LAB — HEMOGLOBIN A1C
Est. average glucose Bld gHb Est-mCnc: 146 mg/dL
Hgb A1c MFr Bld: 6.7 % — ABNORMAL HIGH (ref 4.8–5.6)

## 2023-03-01 NOTE — Assessment & Plan Note (Signed)
Checking labs today.  Will continue supplements as needed.  

## 2023-03-01 NOTE — Assessment & Plan Note (Signed)
Neuropathy is stable.  Patient thinks this may be contributing to her falls.  Checking B12, A1C, and CMP today to be sure that these are normal.   Reassess at follow up

## 2023-03-01 NOTE — Assessment & Plan Note (Signed)
Checking labs today.  Continue current therapy for lipid control. Will modify as needed based on labwork results.  

## 2023-03-01 NOTE — Assessment & Plan Note (Signed)
Continue current meds.  Will adjust as needed based on results.  The patient is asked to make an attempt to improve diet and exercise patterns to aid in medical management of this problem. Addressed importance of increasing and maintaining water intake.   

## 2023-03-01 NOTE — Assessment & Plan Note (Signed)
Checking labs today. Will call pt. With results  Continue current diabetes POC, as patient has been well controlled on current regimen.  Will adjust meds if needed based on labs.  

## 2023-03-14 ENCOUNTER — Ambulatory Visit (INDEPENDENT_AMBULATORY_CARE_PROVIDER_SITE_OTHER): Payer: Medicare HMO

## 2023-03-14 DIAGNOSIS — R4182 Altered mental status, unspecified: Secondary | ICD-10-CM

## 2023-03-14 DIAGNOSIS — R413 Other amnesia: Secondary | ICD-10-CM | POA: Diagnosis not present

## 2023-03-14 DIAGNOSIS — R29818 Other symptoms and signs involving the nervous system: Secondary | ICD-10-CM | POA: Diagnosis not present

## 2023-03-14 MED ORDER — IOHEXOL 300 MG/ML  SOLN
100.0000 mL | Freq: Once | INTRAMUSCULAR | Status: AC | PRN
  Administered 2023-03-14: 100 mL via INTRAVENOUS

## 2023-04-17 ENCOUNTER — Other Ambulatory Visit: Payer: Self-pay | Admitting: Family

## 2023-04-20 ENCOUNTER — Telehealth: Payer: Self-pay

## 2023-04-20 NOTE — Telephone Encounter (Signed)
Pts son called about her CT she had done in November, has there been a referral sent for neurology? Son is wanting to know what the next steps are going to be-please advise-HQ

## 2023-04-26 ENCOUNTER — Other Ambulatory Visit: Payer: Self-pay | Admitting: Family

## 2023-05-31 IMAGING — MG MM DIGITAL SCREENING BILAT W/ TOMO AND CAD
8 of 15 series · 8 of 40 positions shown · non-contrast
Comparison: Previous exam(s).

CLINICAL DATA: Screening.

EXAM:
DIGITAL SCREENING BILATERAL MAMMOGRAM WITH TOMOSYNTHESIS AND CAD
TECHNIQUE: Bilateral screening digital craniocaudal and mediolateral oblique
mammograms were obtained. Bilateral screening digital breast
tomosynthesis was performed. The images were evaluated with
computer-aided detection.

[R MLO synth-2D (1 of 2)]
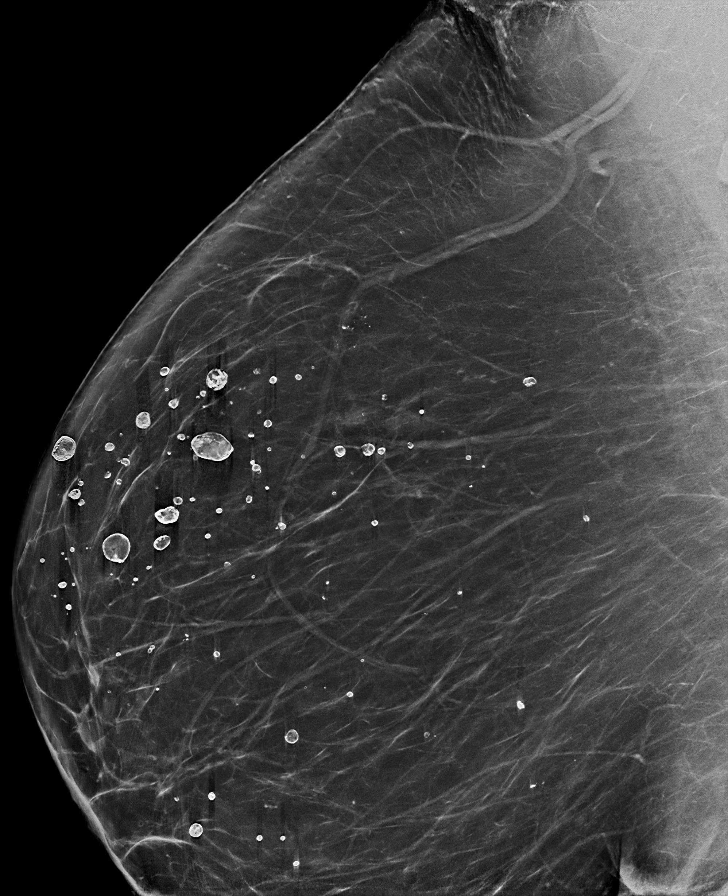

[L CC synth-2D]
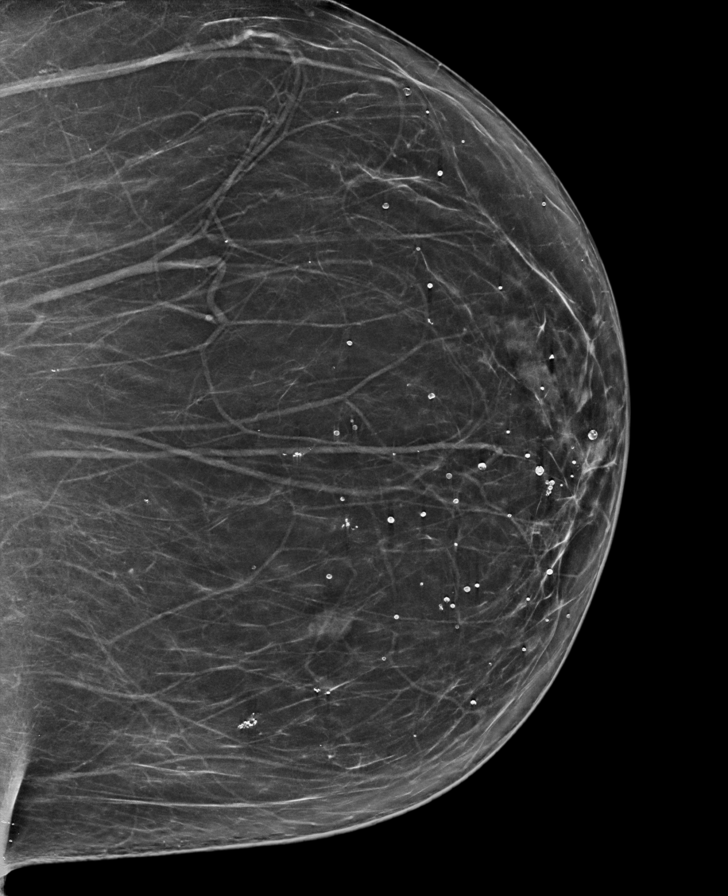

[L MLO synth-2D (1 of 2)]
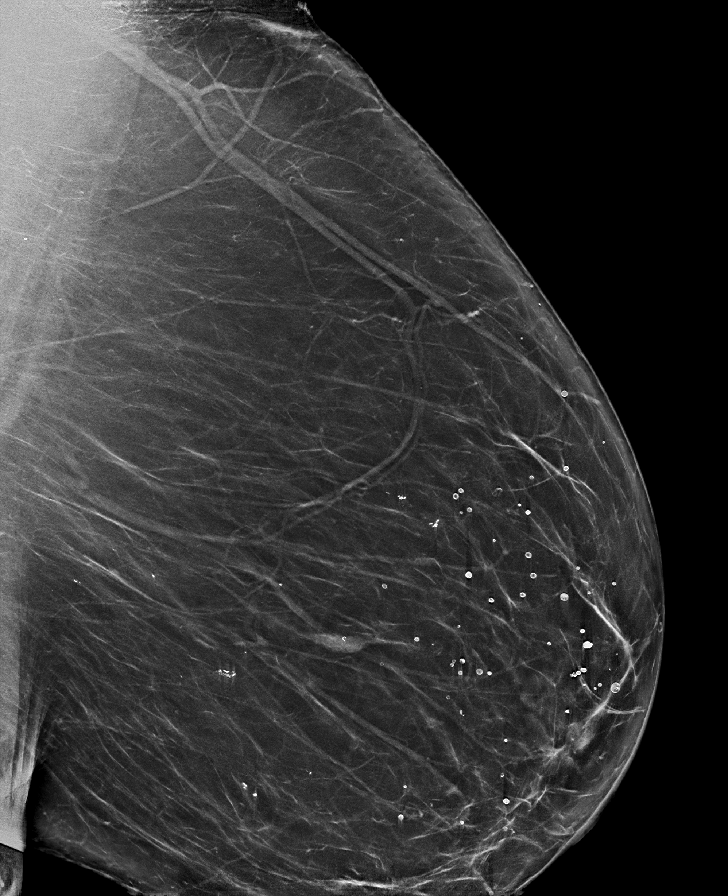

[R MLO synth-2D (2 of 2)]
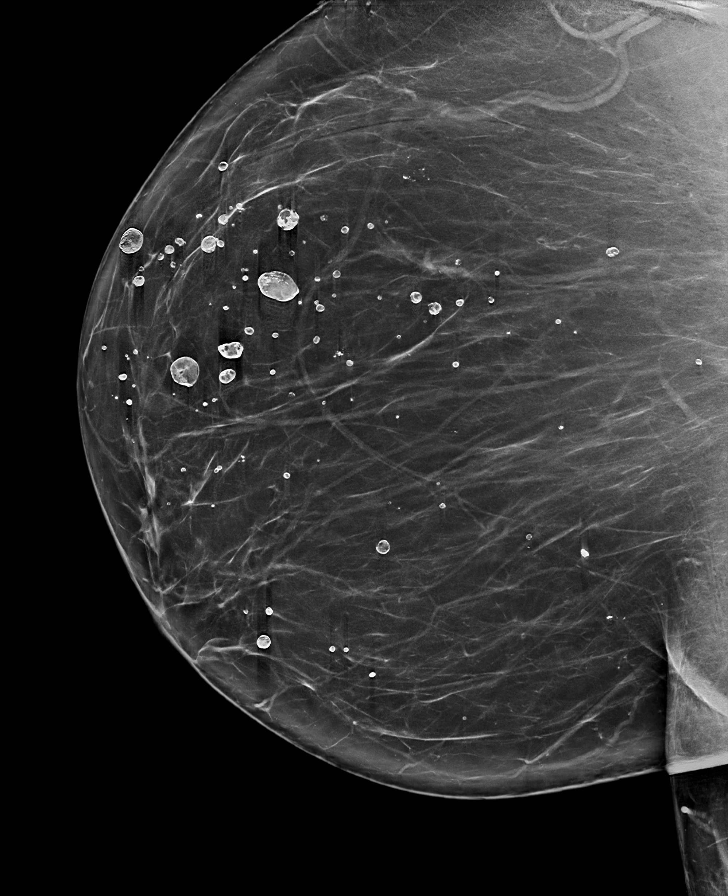

[R CC synth-2D (1 of 2)]
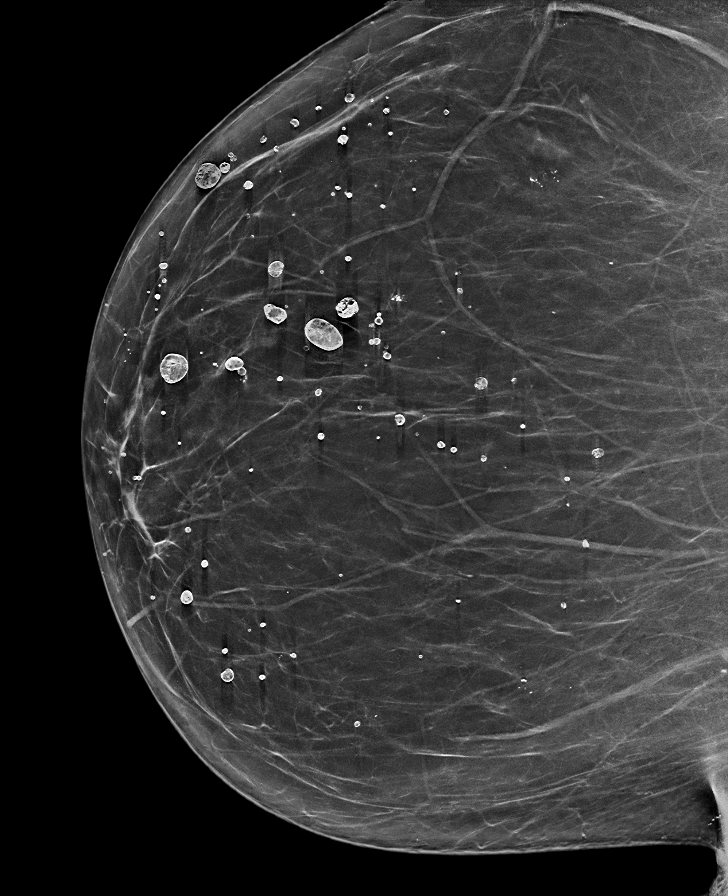

[L MLO synth-2D (2 of 2)]
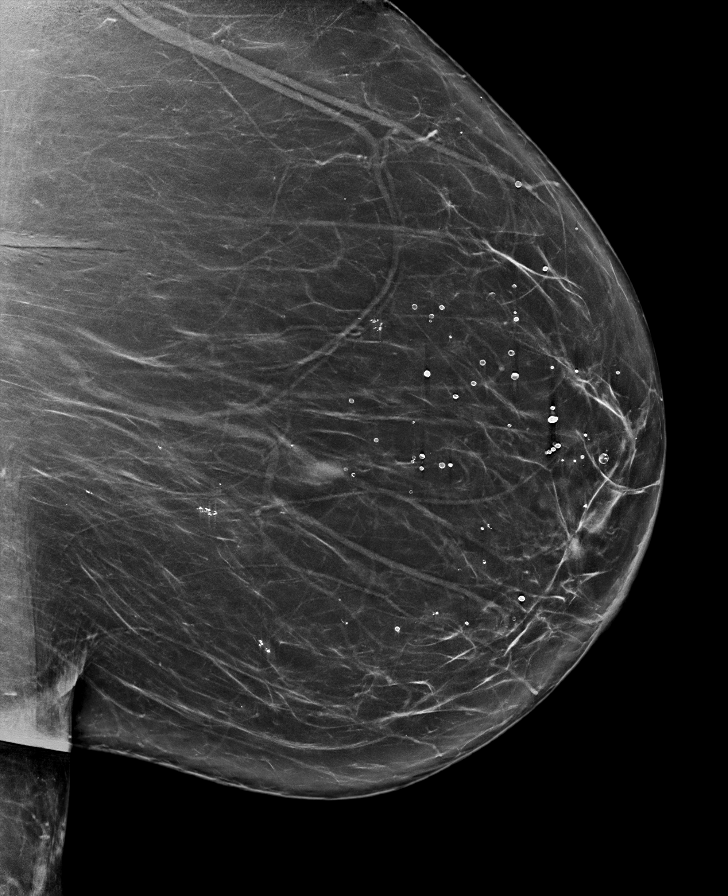

[R CC synth-2D (2 of 2)]
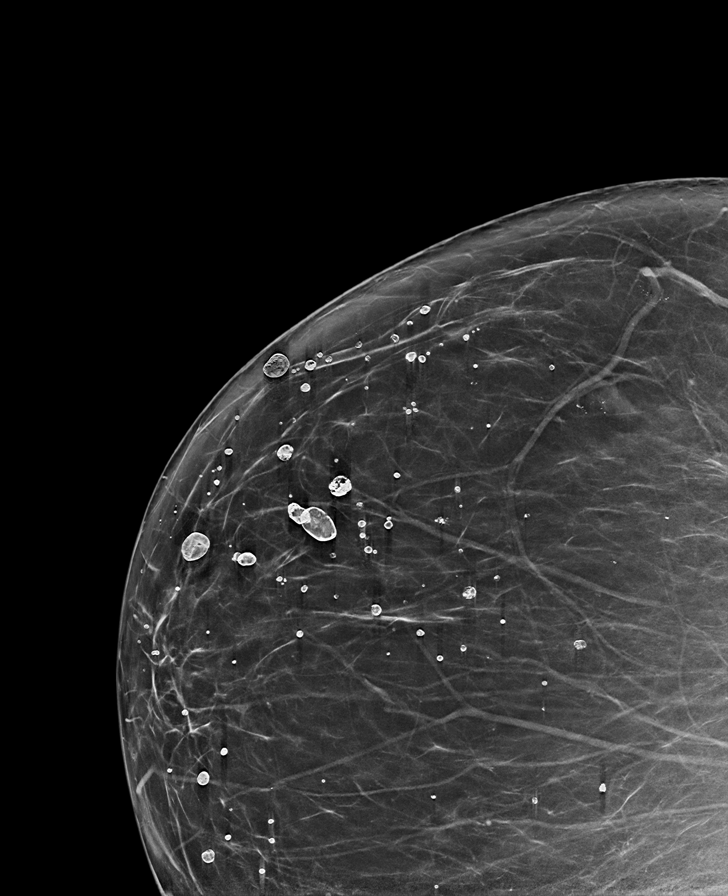

[L MLO tomo · tomo slice 59/87.0]
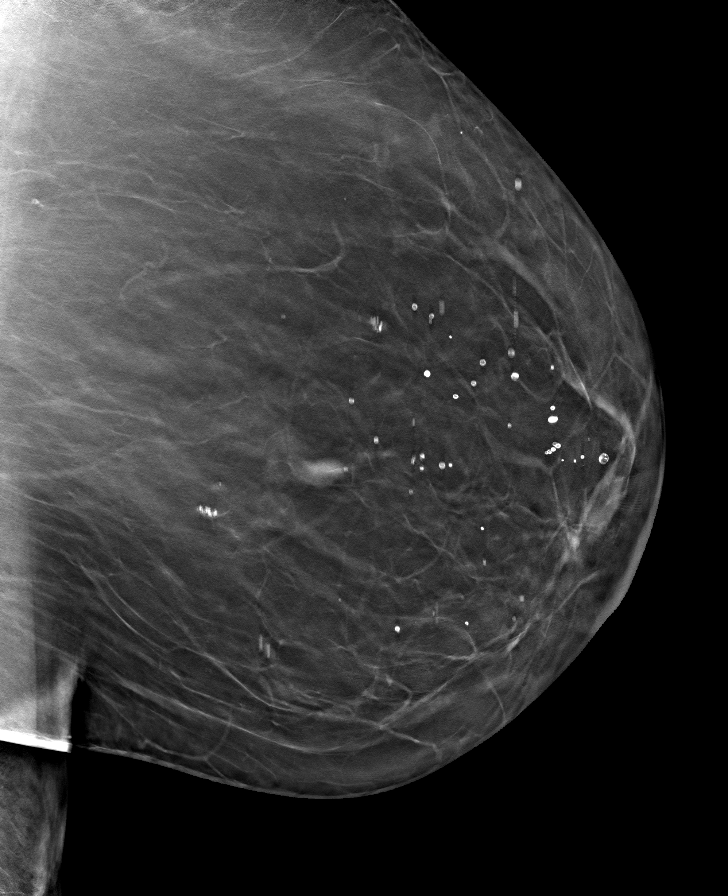

[8 of 40 positions shown; findings below may reference images not displayed]

ACR Breast Density Category b: There are scattered areas of
fibroglandular density.
FINDINGS: There are no findings suspicious for malignancy.
IMPRESSION: No mammographic evidence of malignancy. A result letter of this
screening mammogram will be mailed directly to the patient.

RECOMMENDATION:
Screening mammogram in one year. (Code:51-O-LD2)

BI-RADS CATEGORY  1: Negative.

## 2023-06-05 ENCOUNTER — Telehealth: Payer: Self-pay | Admitting: Family

## 2023-06-05 NOTE — Telephone Encounter (Signed)
Patient left VM with name and phone number.  Called patient back and she needs an acute visit. Patient scheduled for tomorrow with NK.

## 2023-06-06 ENCOUNTER — Ambulatory Visit (INDEPENDENT_AMBULATORY_CARE_PROVIDER_SITE_OTHER): Payer: 59 | Admitting: Internal Medicine

## 2023-06-06 ENCOUNTER — Encounter: Payer: Self-pay | Admitting: Internal Medicine

## 2023-06-06 VITALS — BP 120/76 | HR 81 | Temp 98.6°F | Ht 59.0 in | Wt 251.0 lb

## 2023-06-06 DIAGNOSIS — Z794 Long term (current) use of insulin: Secondary | ICD-10-CM

## 2023-06-06 DIAGNOSIS — E782 Mixed hyperlipidemia: Secondary | ICD-10-CM | POA: Diagnosis not present

## 2023-06-06 DIAGNOSIS — E1159 Type 2 diabetes mellitus with other circulatory complications: Secondary | ICD-10-CM

## 2023-06-06 DIAGNOSIS — E1169 Type 2 diabetes mellitus with other specified complication: Secondary | ICD-10-CM | POA: Diagnosis not present

## 2023-06-06 DIAGNOSIS — R6889 Other general symptoms and signs: Secondary | ICD-10-CM

## 2023-06-06 DIAGNOSIS — R051 Acute cough: Secondary | ICD-10-CM | POA: Diagnosis not present

## 2023-06-06 DIAGNOSIS — E1142 Type 2 diabetes mellitus with diabetic polyneuropathy: Secondary | ICD-10-CM | POA: Diagnosis not present

## 2023-06-06 DIAGNOSIS — I152 Hypertension secondary to endocrine disorders: Secondary | ICD-10-CM | POA: Diagnosis not present

## 2023-06-06 LAB — POC INFLUENZA A&B (BINAX/QUICKVUE)
Influenza A, POC: NEGATIVE
Influenza B, POC: NEGATIVE

## 2023-06-06 LAB — POCT CBG (FASTING - GLUCOSE)-MANUAL ENTRY: Glucose Fasting, POC: 103 mg/dL — AB (ref 70–99)

## 2023-06-06 NOTE — Progress Notes (Signed)
Established Patient Office Visit  Subjective:  Patient ID: April Harrison, female    DOB: 1956/03/23  Age: 68 y.o. MRN: [id number]  Chief Complaint  Patient presents with   Cough    Patient comes in with 3-day history of worsening URI symptoms.  Other family members are also affected.  Complaints of nasal sinus congestion, along with bodyaches, chills and low-grade fever.  She has been coughing but only clear sputum comes out if any.  Has a mild sore throat and postnasal drip.  Rapid flu test done in the office is negative.  Patient advised that this is a viral syndrome but she should check for COVID at home and let us know.  If negative she needs to just rest in bed with plenty of fluids and take Tylenol, and cold and sinus medicine for comfort.  Patient given a work note to rest at home for next 3 days.  Cough This is a new problem. The current episode started in the past 7 days. The problem has been gradually worsening. The problem occurs every few minutes. The cough is Non-productive. Associated symptoms include chills, a fever, headaches, myalgias, nasal congestion, postnasal drip, rhinorrhea and a sore throat. Pertinent negatives include no chest pain, heartburn, hemoptysis, rash, shortness of breath, sweats or weight loss. The treatment provided mild relief.    No other concerns at this time.   Past Medical History:  Diagnosis Date   Anxiety    Arthritis    Depression    Diabetes mellitus    Type 2   Family history of adverse reaction to anesthesia    sister almost died   History of chicken pox    Hypertension    Measles    Mumps    Osteoarthritis of carpometacarpal (CMC) joint of thumb 01/06/2022   Pain in right hand 01/06/2022   Pain of left hand 01/06/2022   Pancreatitis    Sleep apnea    told that she doesn't have it now   Stiffness of finger joint of left hand 02/03/2022    Past Surgical History:  Procedure Laterality Date   CESAREAN SECTION     COLONOSCOPY  WITH PROPOFOL N/A 07/21/2017   Procedure: COLONOSCOPY WITH PROPOFOL;  Surgeon: Toney Reil, MD;  Location: [facility]COPY;  Service: Gastroenterology;  Laterality: N/A;   HARDWARE REMOVAL Left    knee   HYSTEROSCOPY WITH D & C N/A 08/29/2019   Procedure: DILATATION AND CURETTAGE /HYSTEROSCOPY & MYOSURE POYLPECTOMY AND MYOMECTOMY;  Surgeon: Vena Austria, MD;  Location: [facility] ORS;  Service: Gynecology;  Laterality: N/A;   KNEE SURGERY Left     Social History   Socioeconomic History   Marital status: Single    Spouse name: Not on file   Number of children: Not on file   Years of education: Not on file   Highest education level: Not on file  Occupational History   Not on file  Tobacco Use   Smoking status: Never   Smokeless tobacco: Never  Vaping Use   Vaping status: Never Used  Substance and Sexual Activity   Alcohol use: No   Drug use: No   Sexual activity: Not Currently    Birth control/protection: None  Other Topics Concern   Not on file  Social History Narrative   ** Merged History Encounter **       Social Drivers of Corporate investment banker Strain: Not on file  Food Insecurity: Not on file  Transportation Needs:  Not on file  Physical Activity: Not on file  Stress: Not on file  Social Connections: Not on file  Intimate Partner Violence: Not on file    Family History  Problem Relation Age of Onset   Breast cancer Neg Hx     Allergies  Allergen Reactions   Penicillin G     Other reaction(s): Unknown   Gabapentin Rash    Outpatient Medications Prior to Visit  Medication Sig   aspirin 81 MG tablet Take 81 mg by mouth daily.     atorvastatin (LIPITOR) 10 MG tablet Take 10 mg by mouth daily.   Calcium Carb-Cholecalciferol (CALCIUM/VITAMIN D) 600-400 MG-UNIT TABS Take 1 tablet by mouth daily.   Fluticasone-Umeclidin-Vilant (TRELEGY ELLIPTA) 100-62.5-25 MCG/INH AEPB Inhale 1 puff into the lungs daily.   furosemide (LASIX) 40 MG tablet Take 1  tablet (40 mg total) by mouth daily.   glucose blood test strip 1 each by Other route as needed. Use as instructed    KRILL OIL OMEGA-3 PO Take 1 capsule by mouth daily.   lisinopril-hydrochlorothiazide (ZESTORETIC) 20-25 MG tablet TAKE 1 TABLET BY MOUTH ONCE  DAILY   metoprolol tartrate (LOPRESSOR) 25 MG tablet TAKE 1 TABLET BY MOUTH TWICE  DAILY   Multiple Vitamins-Minerals (MULTIVITAMIN WITH MINERALS) tablet Take 1 tablet by mouth daily.   nystatin cream (MYCOSTATIN) Apply 1 application topically 2 (two) times daily.   sertraline (ZOLOFT) 100 MG tablet Take 1.5 tablets (150 mg total) by mouth daily.   tirzepatide (MOUNJARO) 15 MG/0.5ML Pen INJECT 15MG  (1 PEN) UNDER THE SKIN EVERY WEEK   TRESIBA FLEXTOUCH 100 UNIT/ML FlexTouch Pen INJECT 65 UNITS INTO SKIN ONCE EVERY EVENING.   zinc gluconate 50 MG tablet Take 50 mg by mouth daily.   Ascorbic Acid (VITAMIN C) 1000 MG tablet Take 1,000 mg by mouth daily. (Patient not taking: Reported on 02/24/2023)   cetirizine (ZYRTEC) 10 MG tablet Take 10 mg by mouth daily.   (Patient not taking: Reported on 02/24/2023)   Empagliflozin-Linaglip-Metform (TRIJARDY XR) 25-09-998 MG TB24 Trijardy XR 25-09-998 MG Oral Tablet Extended Release 24 Hour QTY: 90 tablet Days: 90 Refills: 1  Written: 11/22/19 Patient Instructions: take 1 tablet by mouth once daily. (Patient not taking: Reported on 06/06/2023)   insulin glargine (LANTUS) 100 UNIT/ML injection Inject 40 Units into the skin 2 (two) times daily.  (Patient not taking: Reported on 02/24/2023)   trimethoprim-polymyxin b (POLYTRIM) ophthalmic solution Place 1 drop into both eyes every 4 (four) hours. (Patient not taking: Reported on 06/06/2023)   valACYclovir (VALTREX) 1000 MG tablet Take 1,000 mg by mouth 3 (three) times daily. (Patient not taking: Reported on 06/06/2023)   No facility-administered medications prior to visit.    Review of Systems  Constitutional:  Positive for chills, fever and malaise/fatigue.  Negative for diaphoresis and weight loss.  HENT:  Positive for congestion, postnasal drip, rhinorrhea, sinus pain and sore throat. Negative for hearing loss.   Respiratory:  Positive for cough. Negative for hemoptysis and shortness of breath.   Cardiovascular:  Negative for chest pain.  Gastrointestinal:  Negative for abdominal pain, constipation, diarrhea, heartburn, nausea and vomiting.  Genitourinary:  Negative for dysuria.  Musculoskeletal:  Positive for myalgias.  Skin:  Negative for rash.  Neurological:  Positive for headaches.       Objective:   BP 120/76   Pulse 81   Temp 98.6 F (37 C) (Tympanic)   Ht 4\' 11"  (1.499 m)   Wt 251 lb (113.9 kg)  SpO2 98%   BMI 50.70 kg/m   Vitals:   06/06/23 1111  BP: 120/76  Pulse: 81  Temp: 98.6 F (37 C)  Height: 4\' 11"  (1.499 m)  Weight: 251 lb (113.9 kg)  SpO2: 98%  TempSrc: Tympanic  BMI (Calculated): 50.67    Physical Exam Vitals and nursing note reviewed.  Constitutional:      Appearance: Normal appearance.  HENT:     Head: Normocephalic and atraumatic.     Nose: Nose normal.     Mouth/Throat:     Mouth: Mucous membranes are moist.     Pharynx: Oropharynx is clear.  Eyes:     Conjunctiva/sclera: Conjunctivae normal.     Pupils: Pupils are equal, round, and reactive to light.  Cardiovascular:     Rate and Rhythm: Normal rate and regular rhythm.     Pulses: Normal pulses.     Heart sounds: Normal heart sounds. No murmur heard. Pulmonary:     Effort: Pulmonary effort is normal.     Breath sounds: Normal breath sounds. No wheezing.  Abdominal:     General: Bowel sounds are normal.     Palpations: Abdomen is soft.     Tenderness: There is no abdominal tenderness. There is no right CVA tenderness or left CVA tenderness.  Musculoskeletal:        General: Normal range of motion.     Cervical back: Normal range of motion.     Right lower leg: No edema.     Left lower leg: No edema.  Skin:    General: Skin is  warm and dry.  Neurological:     General: No focal deficit present.     Mental Status: She is alert and oriented to person, place, and time.  Psychiatric:        Mood and Affect: Mood normal.        Behavior: Behavior normal.      Results for orders placed or performed in visit on 06/06/23  POCT CBG (Fasting - Glucose)  Result Value Ref Range   Glucose Fasting, POC 103 (A) 70 - 99 mg/dL  POC Influenza A&B (Binax test)  Result Value Ref Range   Influenza A, POC Negative Negative   Influenza B, POC Negative Negative    Recent Results (from the past 2160 hours)  POCT CBG (Fasting - Glucose)     Status: Abnormal   Collection Time: 06/06/23 11:20 AM  Result Value Ref Range   Glucose Fasting, POC 103 (A) 70 - 99 mg/dL  POC Influenza A&B (Binax test)     Status: None   Collection Time: 06/06/23 11:55 AM  Result Value Ref Range   Influenza A, POC Negative Negative   Influenza B, POC Negative Negative      Assessment & Plan:  Patient's flu test is negative. Advised rest, fluid and Tylenol at home. Patient will call if her home COVID test is positive. Problem List Items Addressed This Visit     Hypertension associated with diabetes (HCC)   Diabetes mellitus (HCC)   Relevant Orders   POCT CBG (Fasting - Glucose) (Completed)   Combined hyperlipidemia associated with type 2 diabetes mellitus (HCC)   Other Visit Diagnoses       Flu-like symptoms    -  Primary   Relevant Orders   POC Influenza A&B (Binax test) (Completed)       Follow up as scheduled.  Total time spent: 25 minutes  Margaretann Loveless, MD  06/06/2023  This document may have been prepared by [employer] Voice Recognition software and as such may include unintentional dictation errors.

## 2023-06-11 ENCOUNTER — Encounter: Payer: Self-pay | Admitting: Family

## 2023-06-11 DIAGNOSIS — R4189 Other symptoms and signs involving cognitive functions and awareness: Secondary | ICD-10-CM

## 2023-06-12 ENCOUNTER — Emergency Department
Admission: EM | Admit: 2023-06-12 | Discharge: 2023-06-12 | Disposition: A | Discharge status: Home / Self Care | Payer: Medicare HMO | Attending: Emergency Medicine | Admitting: Emergency Medicine

## 2023-06-12 ENCOUNTER — Other Ambulatory Visit: Payer: Self-pay

## 2023-06-12 ENCOUNTER — Emergency Department: Payer: Medicare HMO

## 2023-06-12 ENCOUNTER — Encounter: Payer: Self-pay | Admitting: Emergency Medicine

## 2023-06-12 DIAGNOSIS — R0602 Shortness of breath: Secondary | ICD-10-CM | POA: Diagnosis not present

## 2023-06-12 DIAGNOSIS — R0600 Dyspnea, unspecified: Secondary | ICD-10-CM | POA: Diagnosis not present

## 2023-06-12 DIAGNOSIS — R079 Chest pain, unspecified: Secondary | ICD-10-CM | POA: Diagnosis not present

## 2023-06-12 DIAGNOSIS — E119 Type 2 diabetes mellitus without complications: Secondary | ICD-10-CM | POA: Insufficient documentation

## 2023-06-12 DIAGNOSIS — J209 Acute bronchitis, unspecified: Secondary | ICD-10-CM | POA: Diagnosis not present

## 2023-06-12 DIAGNOSIS — R059 Cough, unspecified: Secondary | ICD-10-CM | POA: Diagnosis not present

## 2023-06-12 DIAGNOSIS — R918 Other nonspecific abnormal finding of lung field: Secondary | ICD-10-CM | POA: Diagnosis not present

## 2023-06-12 DIAGNOSIS — J45909 Unspecified asthma, uncomplicated: Secondary | ICD-10-CM | POA: Insufficient documentation

## 2023-06-12 DIAGNOSIS — Z20822 Contact with and (suspected) exposure to covid-19: Secondary | ICD-10-CM | POA: Diagnosis not present

## 2023-06-12 LAB — BASIC METABOLIC PANEL
Anion gap: 9 (ref 5–15)
BUN: 17 mg/dL (ref 8–23)
CO2: 26 mmol/L (ref 22–32)
Calcium: 8.9 mg/dL (ref 8.9–10.3)
Chloride: 107 mmol/L (ref 98–111)
Creatinine, Ser: 0.87 mg/dL (ref 0.44–1.00)
GFR, Estimated: >60 mL/min (ref >60)
Glucose, Bld: 98 mg/dL (ref 70–99)
Potassium: 4.2 mmol/L (ref 3.5–5.1)
Sodium: 142 mmol/L (ref 135–145)

## 2023-06-12 LAB — CBC
HCT: 39.7 % (ref 36.0–46.0)
Hemoglobin: 13.2 g/dL (ref 12.0–15.0)
MCH: 26.6 pg (ref 26.0–34.0)
MCHC: 33.2 g/dL (ref 30.0–36.0)
MCV: 79.9 fL — ABNORMAL LOW (ref 80.0–100.0)
Platelets: 187 10*3/uL (ref 150–400)
RBC: 4.97 MIL/uL (ref 3.87–5.11)
RDW: 14.6 % (ref 11.5–15.5)
WBC: 6.5 10*3/uL (ref 4.0–10.5)
nRBC: 0 % (ref 0.0–0.2)

## 2023-06-12 LAB — RESP PANEL BY RT-PCR (RSV, FLU A&B, COVID)  RVPGX2
Influenza A by PCR: NEGATIVE
Influenza B by PCR: NEGATIVE
Resp Syncytial Virus by PCR: NEGATIVE
SARS Coronavirus 2 by RT PCR: NEGATIVE

## 2023-06-12 LAB — CBG MONITORING, ED: Glucose-Capillary: 94 mg/dL (ref 70–99)

## 2023-06-12 LAB — TROPONIN I (HIGH SENSITIVITY): Troponin I (High Sensitivity): 4 ng/L (ref <18)

## 2023-06-12 MED ORDER — ALBUTEROL SULFATE HFA 108 (90 BASE) MCG/ACT IN AERS
2.0000 | INHALATION_SPRAY | RESPIRATORY_TRACT | Status: DC | PRN
Start: 2023-06-12 — End: 2023-06-12
  Filled 2023-06-12: qty 6.7

## 2023-06-12 MED ORDER — GUAIFENESIN-CODEINE 100-10 MG/5ML PO SOLN: 5.0000 mL | Freq: Four times a day (QID) | ORAL | 0 refills | Status: DC | PRN

## 2023-06-12 MED ORDER — IPRATROPIUM-ALBUTEROL 0.5-2.5 (3) MG/3ML IN SOLN
3.0000 mL | Freq: Once | RESPIRATORY_TRACT | Status: AC
  Administered 2023-06-12: 3 mL via RESPIRATORY_TRACT
  Filled 2023-06-12: qty 3

## 2023-06-12 MED ORDER — AZITHROMYCIN 250 MG PO TABS: ORAL_TABLET | ORAL | 0 refills | Status: AC

## 2023-06-12 MED ORDER — IPRATROPIUM-ALBUTEROL 0.5-2.5 (3) MG/3ML IN SOLN
RESPIRATORY_TRACT | Status: AC
  Filled 2023-06-12: qty 6

## 2023-06-12 MED ORDER — ALBUTEROL SULFATE HFA 108 (90 BASE) MCG/ACT IN AERS: 2.0000 | INHALATION_SPRAY | Freq: Four times a day (QID) | RESPIRATORY_TRACT | 2 refills | Status: DC | PRN

## 2023-06-12 NOTE — ED Triage Notes (Signed)
Patient presents via POV with c/o shortness of breath. Reports having upper respiratory symptoms for 1 week and became increasingly short of breath since. She reported last night she began coughing up blood and experienced pain under shoulders and breasts. Patient visibly short of breath with exertion in triage room.Patient reports insurance denied her being able to get respiratory swab last week. Patient reported history of asthma- has rescue inhaler at home. Reported she probably should have used it at home but didn't. Patient oxygen 94% on room air.

## 2023-06-12 NOTE — ED Provider Notes (Signed)
[facility] Provider Note    Event Date/Time   First MD Initiated Contact with Patient 06/12/23 [phone]     (approximate)  History   Chief Complaint: Shortness of Breath (/)  HPI  April Harrison is a 68 y.o. female with a past medical history of asthma, diabetes, presents to the emergency department for 1 week of cough congestion and shortness of breath.  According to the patient for the past 1 week she has been coughing with shortness of breath worse with exertion.  Patient states mild congestion initially had a subjective fever but states that is since dissipated.  Patient states she is asthmatic is supposed to be using inhalers/nebulizer at home but states she is out of both and no longer has a nebulizer machine.  Currently patient is awake alert she is answering questions appropriately speaking in full sentences.  Physical Exam   Triage Vital Signs: ED Triage Vitals  Encounter Vitals Group     BP 06/12/23 0845 (!) 147/79     Systolic BP Percentile --      Diastolic BP Percentile --      Pulse Rate 06/12/23 0845 69     Resp 06/12/23 0845 (!) 24     Temp 06/12/23 0845 97.6 F (36.4 C)     Temp Source 06/12/23 0845 Oral     SpO2 06/12/23 0845 94 %     Weight 06/12/23 0846 251 lb (113.9 kg)     Height --      Head Circumference --      Peak Flow --      Pain Score --      Pain Loc --      Pain Education --      Exclude from Growth Chart --     Most recent vital signs: Vitals:   06/12/23 0845  BP: (!) 147/79  Pulse: 69  Resp: (!) 24  Temp: 97.6 F (36.4 C)  SpO2: 94%    General: Awake, no distress.  CV:  Good peripheral perfusion.  Regular rate and rhythm  Resp:  Normal effort.  Equal breath sounds bilaterally.  Minimal expiratory wheeze.  No rales or rhonchi. Abd:  No distention.  Soft, nontender.  No rebound or guarding.  ED Results / Procedures / Treatments   RADIOLOGY  I have reviewed and interpreted chest x-ray images.  No obvious  consolidation on my evaluation.  Awaiting radiology read.   MEDICATIONS ORDERED IN ED: Medications  albuterol (VENTOLIN HFA) 108 (90 Base) MCG/ACT inhaler 2 puff (has no administration in time range)  ipratropium-albuterol (DUONEB) 0.5-2.5 (3) MG/3ML nebulizer solution 3 mL (has no administration in time range)  ipratropium-albuterol (DUONEB) 0.5-2.5 (3) MG/3ML nebulizer solution 3 mL (has no administration in time range)     IMPRESSION / MDM / ASSESSMENT AND PLAN / ED COURSE  I reviewed the triage vital signs and the nursing notes.  Patient's presentation is most consistent with acute presentation with potential threat to life or bodily function.  Patient presents to the emergency department for cough congestion shortness of breath symptoms have been ongoing for approximately 1 week.  Patient states initially she had a fever but that is since dissipated.  Overall the patient appears well.  She does have a slight expiratory wheeze patient has a history of asthma.  Will check labs and obtain a respiratory panel.  Chest x-ray does not appear to show any obvious consolidation awaiting radiology read.  We will check a  CBC chemistry cardiac enzyme and respiratory panel as a precaution.  We will treat with DuoNebs while awaiting results.  Patient agreeable to plan of care.  Currently satting in the mid 90s on room air.  Patient's lab work is reassuring normal CBC chemistry negative troponin.  Patient's respiratory panel is negative.  Chest x-ray shows no obvious consolidation.  We will treat with antibiotics for likely acute bronchitis.  Have the patient follow-up with her doctor.  Patient agreeable to plan of care.  FINAL CLINICAL IMPRESSION(S) / ED DIAGNOSES   Dyspnea Cough   Note:  This document was prepared using Dragon voice recognition software and may include unintentional dictation errors.   Minna Antis, MD 06/12/23 1055

## 2023-06-28 ENCOUNTER — Other Ambulatory Visit: Payer: Self-pay

## 2023-07-26 ENCOUNTER — Other Ambulatory Visit: Payer: Self-pay

## 2023-07-26 MED ORDER — MOUNJARO 15 MG/0.5ML ~~LOC~~ SOAJ: 15.0000 mg | SUBCUTANEOUS | 1 refills | Status: DC

## 2023-07-27 ENCOUNTER — Other Ambulatory Visit: Payer: Self-pay

## 2023-07-27 ENCOUNTER — Encounter: Payer: Self-pay | Admitting: *Deleted

## 2023-07-27 ENCOUNTER — Emergency Department
Admission: EM | Admit: 2023-07-27 | Discharge: 2023-07-27 | Disposition: A | Discharge status: Home / Self Care | Attending: Emergency Medicine | Admitting: Emergency Medicine

## 2023-07-27 ENCOUNTER — Emergency Department

## 2023-07-27 DIAGNOSIS — M47812 Spondylosis without myelopathy or radiculopathy, cervical region: Secondary | ICD-10-CM | POA: Diagnosis not present

## 2023-07-27 DIAGNOSIS — G44309 Post-traumatic headache, unspecified, not intractable: Secondary | ICD-10-CM | POA: Diagnosis not present

## 2023-07-27 DIAGNOSIS — I10 Essential (primary) hypertension: Secondary | ICD-10-CM | POA: Insufficient documentation

## 2023-07-27 DIAGNOSIS — M542 Cervicalgia: Secondary | ICD-10-CM | POA: Diagnosis not present

## 2023-07-27 DIAGNOSIS — S0003XA Contusion of scalp, initial encounter: Secondary | ICD-10-CM | POA: Insufficient documentation

## 2023-07-27 DIAGNOSIS — M47813 Spondylosis without myelopathy or radiculopathy, cervicothoracic region: Secondary | ICD-10-CM | POA: Diagnosis not present

## 2023-07-27 DIAGNOSIS — W100XXA Fall (on)(from) escalator, initial encounter: Secondary | ICD-10-CM | POA: Insufficient documentation

## 2023-07-27 DIAGNOSIS — S0990XA Unspecified injury of head, initial encounter: Secondary | ICD-10-CM | POA: Diagnosis present

## 2023-07-27 DIAGNOSIS — E119 Type 2 diabetes mellitus without complications: Secondary | ICD-10-CM | POA: Insufficient documentation

## 2023-07-27 DIAGNOSIS — G238 Other specified degenerative diseases of basal ganglia: Secondary | ICD-10-CM | POA: Diagnosis not present

## 2023-07-27 MED ORDER — OXYCODONE-ACETAMINOPHEN 5-325 MG PO TABS
1.0000 | ORAL_TABLET | Freq: Once | ORAL | Status: AC
  Administered 2023-07-27: 1 via ORAL
  Filled 2023-07-27: qty 1

## 2023-07-27 NOTE — ED Notes (Signed)
 First Nurse Note: Pt to ED via [facility] in for fall. [place] hitting her head. Pt did not have LOC. Pt is not on blood thinners.

## 2023-07-27 NOTE — ED Provider Triage Note (Signed)
 Emergency Medicine Provider Triage Evaluation Note  April Harrison , a 67 y.o. female  was evaluated in triage.  Pt complains of headache and neck pain after falling down an escalator.  No loss of consciousness.  Generalized body aches but no specific extremity pain.  No abdominal pain.  Patient has been ambulatory since the injury..  Physical Exam  Ht 4\' 11"  (1.499 m)   Wt 113.4 kg   BMI 50.49 kg/m  Gen:   Awake, no distress   Resp:  Normal effort  MSK:   Moves extremities without difficulty  Other:    Medical Decision Making  Medically screening exam initiated at 3:19 PM.  Appropriate orders placed.  April Harrison was informed that the remainder of the evaluation will be completed by another provider, this initial triage assessment does not replace that evaluation, and the importance of remaining in the ED until their evaluation is complete.  CT head and cervical spine ordered.   Chinita Pester, FNP 07/27/23 [phone]

## 2023-07-27 NOTE — ED Provider Notes (Signed)
 [facility] Provider Note    Event Date/Time   First MD Initiated Contact with Patient 07/27/23 2034     (approximate)   History   Chief Complaint Fall   HPI  April Harrison is a 68 y.o. female with past medical history of hypertension and diabetes who presents to the ED complaining of fall.  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  Since then, she has had significant pain to her posterior scalp as well as her neck.  She denies chest pain, abdominal pain, or extremity pain.  She does not take a blood thinner beyond a daily baby aspirin.     Physical Exam   Triage Vital Signs: ED Triage Vitals  Encounter Vitals Group     BP 07/27/23 1519 130/69     Systolic BP Percentile --      Diastolic BP Percentile --      Pulse Rate 07/27/23 1519 (!) 58     Resp 07/27/23 1519 20     Temp 07/27/23 1519 98 F (36.7 C)     Temp Source 07/27/23 1519 Oral     SpO2 07/27/23 1519 95 %     Weight 07/27/23 1515 250 lb (113.4 kg)     Height 07/27/23 1515 4\' 11"  (1.499 m)     Head Circumference --      Peak Flow --      Pain Score 07/27/23 1515 6     Pain Loc --      Pain Education --      Exclude from Growth Chart --     Most recent vital signs: Vitals:   07/27/23 1519 07/27/23 1830  BP: 130/69 118/76  Pulse: (!) 58 (!) 55  Resp: 20 18  Temp: 98 F (36.7 C) 98 F (36.7 C)  SpO2: 95% 99%    Constitutional: Alert and oriented. Eyes: Conjunctivae are normal. Head: Posterior scalp tenderness with no hematomas or step-offs noted. Nose: No congestion/rhinnorhea. Mouth/Throat: Mucous membranes are moist.  Neck: Midline cervical spine tenderness to palpation noted. Cardiovascular: Normal rate, regular rhythm. Grossly normal heart sounds.  2+ radial pulses  bilaterally. Respiratory: Normal respiratory effort.  No retractions. Lungs CTAB.  No chest wall tenderness to palpation. Gastrointestinal: Soft and nontender. No distention. Musculoskeletal: No lower extremity tenderness nor edema.  No upper extremity bony tenderness to palpation. Neurologic:  Normal speech and language. No gross focal neurologic deficits are appreciated.    ED Results / Procedures / Treatments   Labs (all labs ordered are listed, but only abnormal results are displayed) Labs Reviewed - No data to display  RADIOLOGY CT head reviewed and interpreted by me with no hemorrhage or midline shift.  PROCEDURES:  Critical Care performed: No  Procedures   MEDICATIONS ORDERED IN ED: Medications - No data to display   IMPRESSION / MDM / ASSESSMENT AND PLAN / ED COURSE  I reviewed the triage vital signs and the nursing notes.                              68 y.o. female with past medical history of hypertension and diabetes who presents to the ED complaining of headache and neck pain following fall on an escalator just prior to arrival.  Patient's presentation is most consistent with acute complicated illness / injury requiring diagnostic workup.  Differential diagnosis includes, but is not limited to, intracranial hemorrhage, calvarial fracture, scalp contusion, cervical spine injury.  Patient nontoxic-appearing and in no acute distress, vital signs are unremarkable.  CT head and cervical spine are negative for acute process, no evidence of traumatic injury to her trunk or extremities.  Patient may have a mild concussion and was counseled on concussion precautions.  She was counseled to follow-up with her PCP and to return to the ED for new or worsening symptoms, patient agrees with plan.      FINAL CLINICAL IMPRESSION(S) / ED DIAGNOSES   Final diagnoses:  Fall on escalator, initial encounter  Contusion of scalp, initial encounter     Rx / DC Orders   ED  Discharge Orders     None        Note:  This document was prepared using Dragon voice recognition software and may include unintentional dictation errors.   Chesley Noon, MD 07/27/23 2055

## 2023-07-27 NOTE — ED Triage Notes (Signed)
 Pt to triage via wheelchair.  Pt fell down an escalator today.  Pt has neck pain, back pain, bil shoulder pain, right knee abrasions.  No loc.  Pt has a headache.  No loc.  Pt alert  speech clear.

## 2023-08-15 ENCOUNTER — Encounter: Payer: Self-pay | Admitting: Family

## 2023-08-15 ENCOUNTER — Ambulatory Visit
Admission: RE | Admit: 2023-08-15 | Discharge: 2023-08-15 | Disposition: A | Discharge status: Home / Self Care | Source: Ambulatory Visit | Attending: Family | Admitting: Family

## 2023-08-15 ENCOUNTER — Ambulatory Visit (INDEPENDENT_AMBULATORY_CARE_PROVIDER_SITE_OTHER): Admitting: Family

## 2023-08-15 ENCOUNTER — Ambulatory Visit
Admission: RE | Admit: 2023-08-15 | Discharge: 2023-08-15 | Disposition: A | Discharge status: Home / Self Care | Attending: Family | Admitting: Family

## 2023-08-15 VITALS — BP 140/88 | HR 68 | Ht 59.0 in | Wt 271.6 lb

## 2023-08-15 DIAGNOSIS — M542 Cervicalgia: Secondary | ICD-10-CM | POA: Diagnosis not present

## 2023-08-15 DIAGNOSIS — Z794 Long term (current) use of insulin: Secondary | ICD-10-CM

## 2023-08-15 DIAGNOSIS — E1159 Type 2 diabetes mellitus with other circulatory complications: Secondary | ICD-10-CM | POA: Diagnosis not present

## 2023-08-15 DIAGNOSIS — Z59819 Housing instability, housed unspecified: Secondary | ICD-10-CM

## 2023-08-15 DIAGNOSIS — M5030 Other cervical disc degeneration, unspecified cervical region: Secondary | ICD-10-CM | POA: Diagnosis not present

## 2023-08-15 DIAGNOSIS — G4733 Obstructive sleep apnea (adult) (pediatric): Secondary | ICD-10-CM

## 2023-08-15 DIAGNOSIS — E1142 Type 2 diabetes mellitus with diabetic polyneuropathy: Secondary | ICD-10-CM | POA: Diagnosis not present

## 2023-08-15 DIAGNOSIS — M47816 Spondylosis without myelopathy or radiculopathy, lumbar region: Secondary | ICD-10-CM | POA: Diagnosis not present

## 2023-08-15 DIAGNOSIS — G44321 Chronic post-traumatic headache, intractable: Secondary | ICD-10-CM | POA: Diagnosis not present

## 2023-08-15 DIAGNOSIS — E559 Vitamin D deficiency, unspecified: Secondary | ICD-10-CM

## 2023-08-15 DIAGNOSIS — M51362 Other intervertebral disc degeneration, lumbar region with discogenic back pain and lower extremity pain: Secondary | ICD-10-CM

## 2023-08-15 DIAGNOSIS — M549 Dorsalgia, unspecified: Secondary | ICD-10-CM | POA: Insufficient documentation

## 2023-08-15 DIAGNOSIS — M5136 Other intervertebral disc degeneration, lumbar region with discogenic back pain only: Secondary | ICD-10-CM | POA: Diagnosis not present

## 2023-08-15 DIAGNOSIS — E538 Deficiency of other specified B group vitamins: Secondary | ICD-10-CM | POA: Diagnosis not present

## 2023-08-15 DIAGNOSIS — I152 Hypertension secondary to endocrine disorders: Secondary | ICD-10-CM | POA: Diagnosis not present

## 2023-08-15 DIAGNOSIS — G8929 Other chronic pain: Secondary | ICD-10-CM

## 2023-08-15 DIAGNOSIS — M47812 Spondylosis without myelopathy or radiculopathy, cervical region: Secondary | ICD-10-CM | POA: Diagnosis not present

## 2023-08-15 DIAGNOSIS — R5383 Other fatigue: Secondary | ICD-10-CM

## 2023-08-15 DIAGNOSIS — M4805 Spinal stenosis, thoracolumbar region: Secondary | ICD-10-CM | POA: Diagnosis not present

## 2023-08-15 DIAGNOSIS — E782 Mixed hyperlipidemia: Secondary | ICD-10-CM

## 2023-08-15 DIAGNOSIS — M4802 Spinal stenosis, cervical region: Secondary | ICD-10-CM | POA: Diagnosis not present

## 2023-08-15 DIAGNOSIS — M47814 Spondylosis without myelopathy or radiculopathy, thoracic region: Secondary | ICD-10-CM | POA: Diagnosis not present

## 2023-08-15 MED ORDER — MOUNJARO 5 MG/0.5ML ~~LOC~~ SOAJ
5.0000 mg | SUBCUTANEOUS | 3 refills | Status: DC
Start: 2023-08-15 — End: 2023-08-29

## 2023-08-15 MED ORDER — PAROXETINE HCL 20 MG PO TABS: 20.0000 mg | ORAL_TABLET | Freq: Every day | ORAL | 2 refills | Status: DC

## 2023-08-15 MED ORDER — TRIJARDY XR 25-5-1000 MG PO TB24: 1.0000 | ORAL_TABLET | Freq: Every day | ORAL | 1 refills | Status: DC

## 2023-08-15 MED ORDER — FEXOFENADINE HCL 180 MG PO TABS: 180.0000 mg | ORAL_TABLET | Freq: Every day | ORAL | 1 refills | Status: DC

## 2023-08-15 MED ORDER — FUROSEMIDE 40 MG PO TABS: 40.0000 mg | ORAL_TABLET | Freq: Every day | ORAL | 11 refills | Status: DC

## 2023-08-15 MED ORDER — LISINOPRIL-HYDROCHLOROTHIAZIDE 20-25 MG PO TABS: 1.0000 | ORAL_TABLET | Freq: Every day | ORAL | 3 refills | Status: DC

## 2023-08-15 NOTE — Progress Notes (Signed)
 Established Patient Office Visit  Subjective:  Patient ID: April Harrison, female    DOB: [birth year]  Age: 68 y.o. MRN: [id number]  Chief Complaint  Patient presents with   April Harrison off of an escalator   Ear Fullness    Right ear    Headache    Patient is here today with multiple concerns:  1) Allergies - Causing lots of issues with her ears and eyes.  2) Fell down the escalator, hit her head.  Hasn't been completely right since.  Has been having headaches since then, constant.  3) Edema in her legs.  4) Out of her medications 5) Feels like she needs a new depression med.      No other concerns at this time.   Past Medical History:  Diagnosis Date   Anxiety    Arthritis    Depression    Diabetes mellitus    Type 2   Family history of adverse reaction to anesthesia    sister almost died   History of chicken pox    Hypertension    Measles    Mumps    Osteoarthritis of carpometacarpal (CMC) joint of thumb 01/06/2022   Pain in right hand 01/06/2022   Pain of left hand 01/06/2022   Pancreatitis    Sleep apnea    told that she doesn't have it now   Stiffness of finger joint of left hand 02/03/2022    Past Surgical History:  Procedure Laterality Date   CESAREAN SECTION     COLONOSCOPY WITH PROPOFOL  N/A 07/21/2017   Procedure: COLONOSCOPY WITH PROPOFOL ;  Surgeon: Unk April Skiff, MD;  Location: [facility];  Service: Gastroenterology;  Laterality: N/A;   HARDWARE REMOVAL Left    knee   HYSTEROSCOPY WITH D & C N/A 08/29/2019   Procedure: DILATATION AND CURETTAGE /HYSTEROSCOPY & MYOSURE POYLPECTOMY AND MYOMECTOMY;  Surgeon: April Read, MD;  Location: [facility] ORS;  Service: Gynecology;  Laterality: N/A;   KNEE SURGERY Left     Social History   Socioeconomic History   Marital status: Single    Spouse name: Not on file   Number of children: Not on file   Years of education: Not on file   Highest education level: Not on file  Occupational History    Not on file  Tobacco Use   Smoking status: Never   Smokeless tobacco: Never  Vaping Use   Vaping status: Never Used  Substance and Sexual Activity   Alcohol use: No   Drug use: No   Sexual activity: Not Currently    Birth control/protection: None  Other Topics Concern   Not on file  Social History Narrative   ** Merged History Encounter **       Social Drivers of Corporate investment banker Strain: Not on file  Food Insecurity: Not on file  Transportation Needs: Not on file  Physical Activity: Not on file  Stress: Not on file  Social Connections: Not on file  Intimate Partner Violence: Not on file    Family History  Problem Relation Age of Onset   Breast cancer Neg Hx     Allergies  Allergen Reactions   Penicillin G     Other reaction(s): Unknown   Gabapentin Rash    Review of Systems  Constitutional:  Positive for malaise/fatigue.  HENT:  Positive for ear pain.   Musculoskeletal:  Positive for back pain, falls and joint pain.  Neurological:  Positive for dizziness  and headaches.  Psychiatric/Behavioral:  Positive for depression and memory loss. The patient is nervous/anxious.        Objective:   BP (!) 140/88   Pulse 68   Ht 4' 11 (1.499 m)   Wt 271 lb 9.6 oz (123.2 kg)   SpO2 97%   BMI 54.86 kg/m   Vitals:   08/15/23 1317  BP: (!) 140/88  Pulse: 68  Height: 4' 11 (1.499 m)  Weight: 271 lb 9.6 oz (123.2 kg)  SpO2: 97%  BMI (Calculated): 54.83    Physical Exam Vitals and nursing note reviewed.  Constitutional:      Appearance: Normal appearance. She is normal weight.  HENT:     Head: Normocephalic.   Eyes:     Extraocular Movements: Extraocular movements intact.     Conjunctiva/sclera: Conjunctivae normal.     Pupils: Pupils are equal, round, and reactive to light.    Cardiovascular:     Rate and Rhythm: Normal rate.  Pulmonary:     Effort: Pulmonary effort is normal.   Musculoskeletal:     Cervical back: Normal range of  motion.   Neurological:     General: No focal deficit present.     Mental Status: She is alert and oriented to person, place, and time. Mental status is at baseline.     Motor: Weakness present.   Psychiatric:        Mood and Affect: Mood is anxious and depressed. Affect is flat.        Behavior: Behavior normal. Behavior is cooperative.        Thought Content: Thought content normal.        Cognition and Memory: Cognition is impaired. Memory is impaired.     Comments: Patient has repeated fallen prey to multiple scams typically targeting the elderly, to the point where her son has considered taking control of her financial information/accounts to keep this from happening again.       Results for orders placed or performed in visit on 08/15/23  Lipid panel  Result Value Ref Range   Cholesterol, Total 186 100 - 199 mg/dL   Triglycerides 631 (H) 0 - 149 mg/dL   HDL 66 >60 mg/dL   VLDL Cholesterol Cal 56 (H) 5 - 40 mg/dL   LDL Chol Calc (NIH) 64 0 - 99 mg/dL   Chol/HDL Ratio 2.8 0.0 - 4.4 ratio  VITAMIN D  25 Hydroxy (Vit-D Deficiency, Fractures)  Result Value Ref Range   Vit D, 25-Hydroxy 31.2 30.0 - 100.0 ng/mL  CMP14+EGFR  Result Value Ref Range   Glucose 130 (H) 70 - 99 mg/dL   BUN 14 8 - 27 mg/dL   Creatinine, Ser 9.23 0.57 - 1.00 mg/dL   eGFR 86 >40 fO/fpw/8.26   BUN/Creatinine Ratio 18 12 - 28   Sodium 145 (H) 134 - 144 mmol/L   Potassium 4.1 3.5 - 5.2 mmol/L   Chloride 103 96 - 106 mmol/L   CO2 25 20 - 29 mmol/L   Calcium 9.6 8.7 - 10.3 mg/dL   Total Protein 6.8 6.0 - 8.5 g/dL   Albumin 4.0 3.9 - 4.9 g/dL   Globulin, Total 2.8 1.5 - 4.5 g/dL   Bilirubin Total 0.4 0.0 - 1.2 mg/dL   Alkaline Phosphatase 120 44 - 121 IU/L   AST 24 0 - 40 IU/L   ALT 16 0 - 32 IU/L  TSH  Result Value Ref Range   TSH 0.832 0.450 - 4.500 uIU/mL  Hemoglobin  A1c  Result Value Ref Range   Hgb A1c MFr Bld 6.4 (H) 4.8 - 5.6 %   Est. average glucose Bld gHb Est-mCnc 137 mg/dL  Vitamin  A87  Result Value Ref Range   Vitamin B-12 835 232 - 1,245 pg/mL  CBC with Diff  Result Value Ref Range   WBC 8.3 3.4 - 10.8 x10E3/uL   RBC 4.60 3.77 - 5.28 x10E6/uL   Hemoglobin 12.5 11.1 - 15.9 g/dL   Hematocrit 62.0 65.9 - 46.6 %   MCV 82 79 - 97 fL   MCH 27.2 26.6 - 33.0 pg   MCHC 33.0 31.5 - 35.7 g/dL   RDW 85.0 88.2 - 84.5 %   Platelets 174 150 - 450 x10E3/uL   Neutrophils 71 Not Estab. %   Lymphs 15 Not Estab. %   Monocytes 8 Not Estab. %   Eos 5 Not Estab. %   Basos 1 Not Estab. %   Neutrophils Absolute 5.9 1.4 - 7.0 x10E3/uL   Lymphocytes Absolute 1.3 0.7 - 3.1 x10E3/uL   Monocytes Absolute 0.7 0.1 - 0.9 x10E3/uL   EOS (ABSOLUTE) 0.4 0.0 - 0.4 x10E3/uL   Basophils Absolute 0.1 0.0 - 0.2 x10E3/uL   Immature Granulocytes 0 Not Estab. %   Immature Grans (Abs) 0.0 0.0 - 0.1 x10E3/uL  Iron, TIBC and Ferritin Panel  Result Value Ref Range   Total Iron Binding Capacity 302 250 - 450 ug/dL   UIBC 776 881 - 630 ug/dL   Iron 79 27 - 860 ug/dL   Iron Saturation 26 15 - 55 %   Ferritin 108 15 - 150 ng/mL    Recent Results (from the past 2160 hours)  Lipid panel     Status: Abnormal   Collection Time: 08/15/23  2:33 PM  Result Value Ref Range   Cholesterol, Total 186 100 - 199 mg/dL   Triglycerides 631 (H) 0 - 149 mg/dL   HDL 66 >60 mg/dL   VLDL Cholesterol Cal 56 (H) 5 - 40 mg/dL   LDL Chol Calc (NIH) 64 0 - 99 mg/dL   Chol/HDL Ratio 2.8 0.0 - 4.4 ratio    Comment:                                   T. Chol/HDL Ratio                                             Men  Women                               1/2 Avg.Risk  3.4    3.3                                   Avg.Risk  5.0    4.4                                2X Avg.Risk  9.6    7.1  3X Avg.Risk 23.4   11.0   VITAMIN D  25 Hydroxy (Vit-D Deficiency, Fractures)     Status: None   Collection Time: 08/15/23  2:33 PM  Result Value Ref Range   Vit D, 25-Hydroxy 31.2 30.0 - 100.0 ng/mL     Comment: Vitamin D  deficiency has been defined by the Institute of Medicine and an Endocrine Society practice guideline as a level of serum 25-OH vitamin D  less than 20 ng/mL (1,2). The Endocrine Society went on to further define vitamin D  insufficiency as a level between 21 and 29 ng/mL (2). 1. IOM (Institute of Medicine). 2010. Dietary reference    intakes for calcium and D. [place]: The    [employer]. 2. Holick MF, Binkley [place], Bischoff-Ferrari HA, et al.    Evaluation, treatment, and prevention of vitamin D     deficiency: an Endocrine Society clinical practice    guideline. JCEM. 2011 Jul; 96(7):1911-30.   CMP14+EGFR     Status: Abnormal   Collection Time: 08/15/23  2:33 PM  Result Value Ref Range   Glucose 130 (H) 70 - 99 mg/dL   BUN 14 8 - 27 mg/dL   Creatinine, Ser 9.23 0.57 - 1.00 mg/dL   eGFR 86 >40 fO/fpw/8.26   BUN/Creatinine Ratio 18 12 - 28   Sodium 145 (H) 134 - 144 mmol/L   Potassium 4.1 3.5 - 5.2 mmol/L   Chloride 103 96 - 106 mmol/L   CO2 25 20 - 29 mmol/L   Calcium 9.6 8.7 - 10.3 mg/dL   Total Protein 6.8 6.0 - 8.5 g/dL   Albumin 4.0 3.9 - 4.9 g/dL   Globulin, Total 2.8 1.5 - 4.5 g/dL   Bilirubin Total 0.4 0.0 - 1.2 mg/dL   Alkaline Phosphatase 120 44 - 121 IU/L   AST 24 0 - 40 IU/L   ALT 16 0 - 32 IU/L  TSH     Status: None   Collection Time: 08/15/23  2:33 PM  Result Value Ref Range   TSH 0.832 0.450 - 4.500 uIU/mL  Hemoglobin A1c     Status: Abnormal   Collection Time: 08/15/23  2:33 PM  Result Value Ref Range   Hgb A1c MFr Bld 6.4 (H) 4.8 - 5.6 %    Comment:          Prediabetes: 5.7 - 6.4          Diabetes: >6.4          Glycemic control for adults with diabetes: <7.0    Est. average glucose Bld gHb Est-mCnc 137 mg/dL  Vitamin B12     Status: None   Collection Time: 08/15/23  2:33 PM  Result Value Ref Range   Vitamin B-12 835 232 - 1,245 pg/mL  CBC with Diff     Status: None   Collection Time: 08/15/23  2:33 PM  Result  Value Ref Range   WBC 8.3 3.4 - 10.8 x10E3/uL   RBC 4.60 3.77 - 5.28 x10E6/uL   Hemoglobin 12.5 11.1 - 15.9 g/dL   Hematocrit 62.0 65.9 - 46.6 %   MCV 82 79 - 97 fL   MCH 27.2 26.6 - 33.0 pg   MCHC 33.0 31.5 - 35.7 g/dL   RDW 85.0 88.2 - 84.5 %   Platelets 174 150 - 450 x10E3/uL   Neutrophils 71 Not Estab. %   Lymphs 15 Not Estab. %   Monocytes 8 Not Estab. %   Eos 5 Not Estab. %   Basos 1  Not Estab. %   Neutrophils Absolute 5.9 1.4 - 7.0 x10E3/uL   Lymphocytes Absolute 1.3 0.7 - 3.1 x10E3/uL   Monocytes Absolute 0.7 0.1 - 0.9 x10E3/uL   EOS (ABSOLUTE) 0.4 0.0 - 0.4 x10E3/uL   Basophils Absolute 0.1 0.0 - 0.2 x10E3/uL   Immature Granulocytes 0 Not Estab. %   Immature Grans (Abs) 0.0 0.0 - 0.1 x10E3/uL  Iron, TIBC and Ferritin Panel     Status: None   Collection Time: 08/15/23  2:33 PM  Result Value Ref Range   Total Iron Binding Capacity 302 250 - 450 ug/dL   UIBC 776 881 - 630 ug/dL   Iron 79 27 - 860 ug/dL   Iron Saturation 26 15 - 55 %   Ferritin 108 15 - 150 ng/mL       Assessment & Plan Hypertension associated with diabetes (HCC) Blood pressure well controlled with current medications.  Continue current therapy.  Will reassess at follow up.   - CBC w/Diff - CMP w/eGFR  Obstructive sleep apnea syndrome Continue CPAP.  Patient has been using and reports that she is benefiting from therapy.   Morbid obesity (HCC) Continue current meds.  Will adjust as needed based on results.  The patient is asked to make an attempt to improve diet and exercise patterns to aid in medical management of this problem. Addressed importance of increasing and maintaining water intake.   Type 2 diabetes mellitus with diabetic polyneuropathy, with long-term current use of insulin (HCC) Checking labs today. Will call pt. With results  Continue current diabetes POC, as patient has been well controlled on current regimen.  Will adjust meds if needed based on labs.   -CBC w/Diff -CMP  w/eGFR -Hemoglobin A1C  Polyneuropathy due to type 2 diabetes mellitus (HCC) Patient stable.  Well controlled with current therapy.   Continue current meds.   Other fatigue B12 deficiency due to diet Vitamin D  deficiency Checking labs today.  Will continue supplements as needed.   - Vitamin D  - Vitamin B12 - TSH  Mixed hyperlipidemia Checking labs today.  Continue current therapy for lipid control. Will modify as needed based on labwork results.   -CMP w/eGFR -Lipid Panel  Degeneration of intervertebral disc of lumbar region with discogenic back pain and lower extremity pain Chronic bilateral back pain, unspecified back location Intractable chronic post-traumatic headache Neck pain Xrays of neck/lumbar spine ordered today.  Will call pt with results when available.   Housing insecurity Sending referral to [facility] to see if we can get some assistance for her.     Return in about 2 weeks (around 08/29/2023) for F/U.   Total time spent: 20 minutes  ALAN CHRISTELLA ARRANT, FNP  08/15/2023   This document may have been prepared by [facility] Voice Recognition software and as such may include unintentional dictation errors.

## 2023-08-15 NOTE — Patient Instructions (Signed)
 Allergies - Sending new RX for allergy med.  Sending referral to Neurology to see about headaches and the previous issues we were discussing at last appointment.   Sent in refills for all medications Starting Paroxetine for antidepressant instead of Sertraline.   Also setting up referral to [facility] - they have a pharmacist as well as a Engineer, civil (consulting) and a Child psychotherapist.  Hopefully will help figure out something with regard to your living situation.  I have asked them to call and leave voice mail if they do, so you will know to call them back.   [facility] and back.  Lab work today - will call you with results.

## 2023-08-16 ENCOUNTER — Telehealth: Payer: Self-pay | Admitting: *Deleted

## 2023-08-16 LAB — CMP14+EGFR
ALT: 16 IU/L (ref 0–32)
AST: 24 IU/L (ref 0–40)
Albumin: 4 g/dL (ref 3.9–4.9)
Alkaline Phosphatase: 120 IU/L (ref 44–121)
BUN/Creatinine Ratio: 18 (ref 12–28)
BUN: 14 mg/dL (ref 8–27)
Bilirubin Total: 0.4 mg/dL (ref 0.0–1.2)
CO2: 25 mmol/L (ref 20–29)
Calcium: 9.6 mg/dL (ref 8.7–10.3)
Chloride: 103 mmol/L (ref 96–106)
Creatinine, Ser: 0.76 mg/dL (ref 0.57–1.00)
Globulin, Total: 2.8 g/dL (ref 1.5–4.5)
Glucose: 130 mg/dL — ABNORMAL HIGH (ref 70–99)
Potassium: 4.1 mmol/L (ref 3.5–5.2)
Sodium: 145 mmol/L — ABNORMAL HIGH (ref 134–144)
Total Protein: 6.8 g/dL (ref 6.0–8.5)
eGFR: 86 mL/min/{1.73_m2} (ref >59)

## 2023-08-16 LAB — CBC WITH DIFFERENTIAL/PLATELET
Basophils Absolute: 0.1 10*3/uL (ref 0.0–0.2)
Basos: 1 %
EOS (ABSOLUTE): 0.4 10*3/uL (ref 0.0–0.4)
Eos: 5 %
Hematocrit: 37.9 % (ref 34.0–46.6)
Hemoglobin: 12.5 g/dL (ref 11.1–15.9)
Immature Grans (Abs): 0 10*3/uL (ref 0.0–0.1)
Immature Granulocytes: 0 %
Lymphocytes Absolute: 1.3 10*3/uL (ref 0.7–3.1)
Lymphs: 15 %
MCH: 27.2 pg (ref 26.6–33.0)
MCHC: 33 g/dL (ref 31.5–35.7)
MCV: 82 fL (ref 79–97)
Monocytes Absolute: 0.7 10*3/uL (ref 0.1–0.9)
Monocytes: 8 %
Neutrophils Absolute: 5.9 10*3/uL (ref 1.4–7.0)
Neutrophils: 71 %
Platelets: 174 10*3/uL (ref 150–450)
RBC: 4.6 x10E6/uL (ref 3.77–5.28)
RDW: 14.9 % (ref 11.7–15.4)
WBC: 8.3 10*3/uL (ref 3.4–10.8)

## 2023-08-16 LAB — IRON,TIBC AND FERRITIN PANEL
Ferritin: 108 ng/mL (ref 15–150)
Iron Saturation: 26 % (ref 15–55)
Iron: 79 ug/dL (ref 27–139)
Total Iron Binding Capacity: 302 ug/dL (ref 250–450)
UIBC: 223 ug/dL (ref 118–369)

## 2023-08-16 LAB — LIPID PANEL
Chol/HDL Ratio: 2.8 ratio (ref 0.0–4.4)
Cholesterol, Total: 186 mg/dL (ref 100–199)
HDL: 66 mg/dL (ref >39)
LDL Chol Calc (NIH): 64 mg/dL (ref 0–99)
Triglycerides: 368 mg/dL — ABNORMAL HIGH (ref 0–149)
VLDL Cholesterol Cal: 56 mg/dL — ABNORMAL HIGH (ref 5–40)

## 2023-08-16 LAB — HEMOGLOBIN A1C
Est. average glucose Bld gHb Est-mCnc: 137 mg/dL
Hgb A1c MFr Bld: 6.4 % — ABNORMAL HIGH (ref 4.8–5.6)

## 2023-08-16 LAB — VITAMIN B12: Vitamin B-12: 835 pg/mL (ref 232–1245)

## 2023-08-16 LAB — TSH: TSH: 0.832 u[IU]/mL (ref 0.450–4.500)

## 2023-08-16 LAB — VITAMIN D 25 HYDROXY (VIT D DEFICIENCY, FRACTURES): Vit D, 25-Hydroxy: 31.2 ng/mL (ref 30.0–100.0)

## 2023-08-16 NOTE — Progress Notes (Unsigned)
 Complex Care Management Note Care Guide Note  08/16/2023 Name: April Harrison MRN: [id number] DOB: 1955/12/12   Complex Care Management Outreach Attempts: An unsuccessful telephone outreach was attempted today to offer the patient information about available complex care management services.  Follow Up Plan:  Additional outreach attempts will be made to offer the patient complex care management information and services.   Encounter Outcome:  No Answer  Burman Nieves, CMA [place]  [facility], [facility] Guide Direct Dial: [phone]  Fax: [phone] Website: [email or web address]

## 2023-08-17 NOTE — Progress Notes (Unsigned)
 Complex Care Management Note Care Guide Note  08/17/2023 Name: April Harrison MRN: [id number] DOB: 31-Mar-1956   Complex Care Management Outreach Attempts: A second unsuccessful outreach was attempted today to offer the patient with information about available complex care management services.  Follow Up Plan:  Additional outreach attempts will be made to offer the patient complex care management information and services.   Encounter Outcome:  No Answer  Burman Nieves, CMA [place]  [facility], [facility] Guide Direct Dial: [phone]  Fax: [phone] Website: [email or web address]

## 2023-08-18 NOTE — Progress Notes (Signed)
 Complex Care Management Note Care Guide Note  08/18/2023 Name: April Harrison MRN: [id number] DOB: 06-Mar-1956   Complex Care Management Outreach Attempts: A third unsuccessful outreach was attempted today to offer the patient with information about available complex care management services.  Follow Up Plan:  No further outreach attempts will be made at this time. We have been unable to contact the patient to offer or enroll patient in complex care management services.  Encounter Outcome:  No Answer  Burman Nieves, CMA [place]  [facility], [facility] Guide Direct Dial: [phone]  Fax: [phone] Website: [email or web address]

## 2023-08-22 ENCOUNTER — Telehealth: Payer: Self-pay | Admitting: Family

## 2023-08-22 NOTE — Telephone Encounter (Signed)
 Patient left VM that she is having headaches and either needs to be seen or have something sent in. Do you want to see her or just send something in?

## 2023-08-29 ENCOUNTER — Encounter: Payer: Self-pay | Admitting: Family

## 2023-08-29 ENCOUNTER — Other Ambulatory Visit: Payer: Self-pay

## 2023-08-29 ENCOUNTER — Ambulatory Visit (INDEPENDENT_AMBULATORY_CARE_PROVIDER_SITE_OTHER): Admitting: Family

## 2023-08-29 VITALS — BP 112/70 | HR 54 | Ht 59.0 in | Wt 259.6 lb

## 2023-08-29 DIAGNOSIS — E1142 Type 2 diabetes mellitus with diabetic polyneuropathy: Secondary | ICD-10-CM

## 2023-08-29 DIAGNOSIS — M5442 Lumbago with sciatica, left side: Secondary | ICD-10-CM

## 2023-08-29 DIAGNOSIS — Z1231 Encounter for screening mammogram for malignant neoplasm of breast: Secondary | ICD-10-CM | POA: Diagnosis not present

## 2023-08-29 DIAGNOSIS — Z794 Long term (current) use of insulin: Secondary | ICD-10-CM

## 2023-08-29 DIAGNOSIS — R29898 Other symptoms and signs involving the musculoskeletal system: Secondary | ICD-10-CM

## 2023-08-29 DIAGNOSIS — M5441 Lumbago with sciatica, right side: Secondary | ICD-10-CM

## 2023-08-29 DIAGNOSIS — H9191 Unspecified hearing loss, right ear: Secondary | ICD-10-CM

## 2023-08-29 DIAGNOSIS — F322 Major depressive disorder, single episode, severe without psychotic features: Secondary | ICD-10-CM

## 2023-08-29 DIAGNOSIS — I152 Hypertension secondary to endocrine disorders: Secondary | ICD-10-CM

## 2023-08-29 DIAGNOSIS — Z0130 Encounter for examination of blood pressure without abnormal findings: Secondary | ICD-10-CM

## 2023-08-29 MED ORDER — MOUNJARO 7.5 MG/0.5ML ~~LOC~~ SOAJ: 7.5000 mg | SUBCUTANEOUS | 0 refills | Status: DC

## 2023-08-29 MED ORDER — AZITHROMYCIN 250 MG PO TABS: ORAL_TABLET | ORAL | 0 refills | Status: AC

## 2023-08-29 NOTE — Progress Notes (Signed)
 Established Patient Office Visit  Subjective:  Patient ID: April Harrison, female    DOB: 1956/04/21  Age: 68 y.o. MRN: [id number]  Chief Complaint  Patient presents with   Follow-up    2 week follow up    Walking much worse.  She has to go over bricks to get into her current home and the unevenness and gaps in them cause her to trip frequently.  She had a friend tell her that she wasn't picking up her feet, and she tripped over them and fell.   She has also still been dealing with the memory issues.   Needs multiple referrals;  1) ENT, her ears have been bothering her, as have her sinuses.  2) Neurosurgery - still having severe back pain.  3) Ophthalmology - needs eye exam.   Will send referrals for her.      No other concerns at this time.   Past Medical History:  Diagnosis Date   Anxiety    Arthritis    Depression    Diabetes mellitus    Type 2   Family history of adverse reaction to anesthesia    sister almost died   History of chicken pox    Hypertension    Measles    Mumps    Osteoarthritis of carpometacarpal (CMC) joint of thumb 01/06/2022   Pain in right hand 01/06/2022   Pain of left hand 01/06/2022   Pancreatitis    Sleep apnea    told that she doesn't have it now   Stiffness of finger joint of left hand 02/03/2022    Past Surgical History:  Procedure Laterality Date   CESAREAN SECTION     COLONOSCOPY WITH PROPOFOL  N/A 07/21/2017   Procedure: COLONOSCOPY WITH PROPOFOL ;  Surgeon: Unk Corinn Skiff, MD;  Location: [facility];  Service: Gastroenterology;  Laterality: N/A;   HARDWARE REMOVAL Left    knee   HYSTEROSCOPY WITH D & C N/A 08/29/2019   Procedure: DILATATION AND CURETTAGE /HYSTEROSCOPY & MYOSURE POYLPECTOMY AND MYOMECTOMY;  Surgeon: Lake Read, MD;  Location: [facility];  Service: Gynecology;  Laterality: N/A;   KNEE SURGERY Left     Social History   Socioeconomic History   Marital status: Single    Spouse name: Not on file    Number of children: Not on file   Years of education: Not on file   Highest education level: Not on file  Occupational History   Not on file  Tobacco Use   Smoking status: Never   Smokeless tobacco: Never  Vaping Use   Vaping status: Never Used  Substance and Sexual Activity   Alcohol use: No   Drug use: No   Sexual activity: Not Currently    Birth control/protection: None  Other Topics Concern   Not on file  Social History Narrative   ** Merged History Encounter **       Social Drivers of Corporate investment banker Strain: Not on file  Food Insecurity: Not on file  Transportation Needs: Not on file  Physical Activity: Not on file  Stress: Not on file  Social Connections: Not on file  Intimate Partner Violence: Not on file    Family History  Problem Relation Age of Onset   Breast cancer Neg Hx     Allergies  Allergen Reactions   Penicillin G     Other reaction(s): Unknown   Gabapentin Rash    Review of Systems  Constitutional:  Positive for malaise/fatigue.  HENT:  Positive for congestion, ear pain, sinus pain and tinnitus.   Eyes:  Positive for blurred vision.  Musculoskeletal:  Positive for back pain, falls and neck pain.  Psychiatric/Behavioral:  Positive for depression. The patient is nervous/anxious.   All other systems reviewed and are negative.      Objective:   BP 112/70   Pulse (!) 54   Ht 4' 11 (1.499 m)   Wt 259 lb 9.6 oz (117.8 kg)   SpO2 96%   BMI 52.43 kg/m   Vitals:   08/29/23 1413  BP: 112/70  Pulse: (!) 54  Height: 4' 11 (1.499 m)  Weight: 259 lb 9.6 oz (117.8 kg)  SpO2: 96%  BMI (Calculated): 52.4    Physical Exam Constitutional:      General: She is in acute distress.     Appearance: Normal appearance. She is obese. She is ill-appearing.  Neurological:     General: No focal deficit present.     Mental Status: She is alert and oriented to person, place, and time.     Motor: Weakness present.     Gait: Gait  abnormal.  Psychiatric:        Mood and Affect: Mood normal.        Behavior: Behavior normal.        Thought Content: Thought content normal.        Judgment: Judgment normal.      No results found for any visits on 08/29/23.  Recent Results (from the past 2160 hours)  POCT CBG (Fasting - Glucose)     Status: Abnormal   Collection Time: 06/06/23 11:20 AM  Result Value Ref Range   Glucose Fasting, POC 103 (A) 70 - 99 mg/dL  POC Influenza A&B (Binax test)     Status: None   Collection Time: 06/06/23 11:55 AM  Result Value Ref Range   Influenza A, POC Negative Negative   Influenza B, POC Negative Negative  CBG monitoring, ED     Status: None   Collection Time: 06/12/23  8:46 AM  Result Value Ref Range   Glucose-Capillary 94 70 - 99 mg/dL    Comment: Glucose reference range applies only to samples taken after fasting for at least 8 hours.  Basic metabolic panel     Status: None   Collection Time: 06/12/23  8:52 AM  Result Value Ref Range   Sodium 142 135 - 145 mmol/L   Potassium 4.2 3.5 - 5.1 mmol/L   Chloride 107 98 - 111 mmol/L   CO2 26 22 - 32 mmol/L   Glucose, Bld 98 70 - 99 mg/dL    Comment: Glucose reference range applies only to samples taken after fasting for at least 8 hours.   BUN 17 8 - 23 mg/dL   Creatinine, Ser 9.12 0.44 - 1.00 mg/dL   Calcium 8.9 8.9 - 89.6 mg/dL   GFR, Estimated >39 >39 mL/min    Comment: (NOTE) Calculated using the CKD-EPI Creatinine Equation (2021)    Anion gap 9 5 - 15    Comment: Performed at [facility], [address] [place], [place] [address]  CBC     Status: Abnormal   Collection Time: 06/12/23  8:52 AM  Result Value Ref Range   WBC 6.5 4.0 - 10.5 K/uL   RBC 4.97 3.87 - 5.11 MIL/uL   Hemoglobin 13.2 12.0 - 15.0 g/dL   HCT 60.2 63.9 - 53.9 %   MCV 79.9 (L) 80.0 - 100.0 fL  MCH 26.6 26.0 - 34.0 pg   MCHC 33.2 30.0 - 36.0 g/dL   RDW 85.3 88.4 - 84.4 %   Platelets 187 150 - 400 K/uL   nRBC 0.0 0.0 - 0.2 %     Comment: Performed at [facility], [address] [place], [place] [address]  Troponin I (High Sensitivity)     Status: None   Collection Time: 06/12/23  8:52 AM  Result Value Ref Range   Troponin I (High Sensitivity) 4 <18 ng/L    Comment: (NOTE) Elevated high sensitivity troponin I (hsTnI) values and significant  changes across serial measurements may suggest ACS but many other  chronic and acute conditions are known to elevate hsTnI results.  Refer to the Links section for chest pain algorithms and additional  guidance. Performed at [facility], [address] [place], [place] [address]   Resp panel by RT-PCR (RSV, Flu A&B, Covid) Anterior Nasal Swab     Status: None   Collection Time: 06/12/23  8:52 AM   Specimen: Anterior Nasal Swab  Result Value Ref Range   SARS Coronavirus 2 by RT PCR NEGATIVE NEGATIVE    Comment: (NOTE) SARS-CoV-2 target nucleic acids are NOT DETECTED.  The SARS-CoV-2 RNA is generally detectable in upper respiratory specimens during the acute phase of infection. The lowest concentration of SARS-CoV-2 viral copies this assay can detect is 138 copies/mL. A negative result does not preclude SARS-Cov-2 infection and should not be used as the sole basis for treatment or other patient management decisions. A negative result may occur with  improper specimen collection/handling, submission of specimen other than nasopharyngeal swab, presence of viral mutation(s) within the areas targeted by this assay, and inadequate number of viral copies(<138 copies/mL). A negative result must be combined with clinical observations, patient history, and epidemiological information. The expected result is Negative.  Fact Sheet for Patients:  BloggerCourse.com  Fact Sheet for Healthcare Providers:  SeriousBroker.it  This test is no t yet approved or cleared by the [place]  FDA and  has been  authorized for detection and/or diagnosis of SARS-CoV-2 by FDA under an Emergency Use Authorization (EUA). This EUA will remain  in effect (meaning this test can be used) for the duration of the COVID-19 declaration under Section 564(b)(1) of the Act, 21 U.S.C.section 360bbb-3(b)(1), unless the authorization is terminated  or revoked sooner.       Influenza A by PCR NEGATIVE NEGATIVE   Influenza B by PCR NEGATIVE NEGATIVE    Comment: (NOTE) The Xpert Xpress SARS-CoV-2/FLU/RSV plus assay is intended as an aid in the diagnosis of influenza from Nasopharyngeal swab specimens and should not be used as a sole basis for treatment. Nasal washings and aspirates are unacceptable for Xpert Xpress SARS-CoV-2/FLU/RSV testing.  Fact Sheet for Patients: BloggerCourse.com  Fact Sheet for Healthcare Providers: SeriousBroker.it  This test is not yet approved or cleared by the [place]  FDA and has been authorized for detection and/or diagnosis of SARS-CoV-2 by FDA under an Emergency Use Authorization (EUA). This EUA will remain in effect (meaning this test can be used) for the duration of the COVID-19 declaration under Section 564(b)(1) of the Act, 21 U.S.C. section 360bbb-3(b)(1), unless the authorization is terminated or revoked.     Resp Syncytial Virus by PCR NEGATIVE NEGATIVE    Comment: (NOTE) Fact Sheet for Patients: BloggerCourse.com  Fact Sheet for Healthcare Providers: SeriousBroker.it  This test is not yet approved or cleared by the [place]  FDA and has been  authorized for detection and/or diagnosis of SARS-CoV-2 by FDA under an Emergency Use Authorization (EUA). This EUA will remain in effect (meaning this test can be used) for the duration of the COVID-19 declaration under Section 564(b)(1) of the Act, 21 U.S.C. section 360bbb-3(b)(1), unless the authorization is  terminated or revoked.  Performed at [facility], [address] [place], [place] [address]   Lipid panel     Status: Abnormal   Collection Time: 08/15/23  2:33 PM  Result Value Ref Range   Cholesterol, Total 186 100 - 199 mg/dL   Triglycerides 631 (H) 0 - 149 mg/dL   HDL 66 >60 mg/dL   VLDL Cholesterol Cal 56 (H) 5 - 40 mg/dL   LDL Chol Calc (NIH) 64 0 - 99 mg/dL   Chol/HDL Ratio 2.8 0.0 - 4.4 ratio    Comment:                                   T. Chol/HDL Ratio                                             Men  Women                               1/2 Avg.Risk  3.4    3.3                                   Avg.Risk  5.0    4.4                                2X Avg.Risk  9.6    7.1                                3X Avg.Risk 23.4   11.0   VITAMIN D  25 Hydroxy (Vit-D Deficiency, Fractures)     Status: None   Collection Time: 08/15/23  2:33 PM  Result Value Ref Range   Vit D, 25-Hydroxy 31.2 30.0 - 100.0 ng/mL    Comment: Vitamin D  deficiency has been defined by the Institute of Medicine and an Endocrine Society practice guideline as a level of serum 25-OH vitamin D  less than 20 ng/mL (1,2). The Endocrine Society went on to further define vitamin D  insufficiency as a level between 21 and 29 ng/mL (2). 1. IOM (Institute of Medicine). 2010. Dietary reference    intakes for calcium and D. [place]: The    [employer]. 2. Holick MF, Binkley Wall, Bischoff-Ferrari HA, et al.    Evaluation, treatment, and prevention of vitamin D     deficiency: an Endocrine Society clinical practice    guideline. JCEM. 2011 Jul; 96(7):1911-30.   CMP14+EGFR     Status: Abnormal   Collection Time: 08/15/23  2:33 PM  Result Value Ref Range   Glucose 130 (H) 70 - 99 mg/dL   BUN 14 8 - 27 mg/dL   Creatinine, Ser 9.23 0.57 - 1.00 mg/dL   eGFR 86 >40 fO/fpw/8.26   BUN/Creatinine Ratio 18 12 -  28   Sodium 145 (H) 134 - 144 mmol/L   Potassium 4.1 3.5 - 5.2 mmol/L   Chloride 103  96 - 106 mmol/L   CO2 25 20 - 29 mmol/L   Calcium 9.6 8.7 - 10.3 mg/dL   Total Protein 6.8 6.0 - 8.5 g/dL   Albumin 4.0 3.9 - 4.9 g/dL   Globulin, Total 2.8 1.5 - 4.5 g/dL   Bilirubin Total 0.4 0.0 - 1.2 mg/dL   Alkaline Phosphatase 120 44 - 121 IU/L   AST 24 0 - 40 IU/L   ALT 16 0 - 32 IU/L  TSH     Status: None   Collection Time: 08/15/23  2:33 PM  Result Value Ref Range   TSH 0.832 0.450 - 4.500 uIU/mL  Hemoglobin A1c     Status: Abnormal   Collection Time: 08/15/23  2:33 PM  Result Value Ref Range   Hgb A1c MFr Bld 6.4 (H) 4.8 - 5.6 %    Comment:          Prediabetes: 5.7 - 6.4          Diabetes: >6.4          Glycemic control for adults with diabetes: <7.0    Est. average glucose Bld gHb Est-mCnc 137 mg/dL  Vitamin A87     Status: None   Collection Time: 08/15/23  2:33 PM  Result Value Ref Range   Vitamin B-12 835 232 - 1,245 pg/mL  CBC with Diff     Status: None   Collection Time: 08/15/23  2:33 PM  Result Value Ref Range   WBC 8.3 3.4 - 10.8 x10E3/uL   RBC 4.60 3.77 - 5.28 x10E6/uL   Hemoglobin 12.5 11.1 - 15.9 g/dL   Hematocrit 62.0 65.9 - 46.6 %   MCV 82 79 - 97 fL   MCH 27.2 26.6 - 33.0 pg   MCHC 33.0 31.5 - 35.7 g/dL   RDW 85.0 88.2 - 84.5 %   Platelets 174 150 - 450 x10E3/uL   Neutrophils 71 Not Estab. %   Lymphs 15 Not Estab. %   Monocytes 8 Not Estab. %   Eos 5 Not Estab. %   Basos 1 Not Estab. %   Neutrophils Absolute 5.9 1.4 - 7.0 x10E3/uL   Lymphocytes Absolute 1.3 0.7 - 3.1 x10E3/uL   Monocytes Absolute 0.7 0.1 - 0.9 x10E3/uL   EOS (ABSOLUTE) 0.4 0.0 - 0.4 x10E3/uL   Basophils Absolute 0.1 0.0 - 0.2 x10E3/uL   Immature Granulocytes 0 Not Estab. %   Immature Grans (Abs) 0.0 0.0 - 0.1 x10E3/uL  Iron, TIBC and Ferritin Panel     Status: None   Collection Time: 08/15/23  2:33 PM  Result Value Ref Range   Total Iron Binding Capacity 302 250 - 450 ug/dL   UIBC 776 881 - 630 ug/dL   Iron 79 27 - 860 ug/dL   Iron Saturation 26 15 - 55 %   Ferritin  108 15 - 150 ng/mL       Assessment & Plan Acute bilateral low back pain with bilateral sciatica Weakness of both lower extremities Setting patient up for referral to Neurosurgery .  Will defer to them for further treatment changes.  Reassess at follow up.  Type 2 diabetes mellitus with diabetic polyneuropathy, with long-term current use of insulin (HCC) Continue current diabetes POC, as patient has been well controlled on current regimen.  Will adjust meds if needed based on labs.   Screening mammogram  for breast cancer Mammo ordered today Pt aware they will call to schedule her.   Hearing loss of right ear, unspecified hearing loss type Setting patient up for referral to ENT .  Will defer to them for further treatment changes.  Reassess at follow up.  Moderately severe major depression (HCC) Patient stable.  Well controlled with current therapy.   Continue current meds.   Morbid obesity (HCC) Continue current meds.  Will adjust as needed based on results.  The patient is asked to make an attempt to improve diet and exercise patterns to aid in medical management of this problem. Addressed importance of increasing and maintaining water intake.      Return in about 1 month (around 09/28/2023) for F/U.   Total time spent: 30 minutes  ALAN CHRISTELLA ARRANT, FNP  08/29/2023   This document may have been prepared by [facility] Voice Recognition software and as such may include unintentional dictation errors.

## 2023-08-29 NOTE — Patient Instructions (Addendum)
 Referrals:  1) Sent to Neurosurgery, for low back pain.  2) Sent to ENT - hearing loss 3) Ophthalmology - eye exam 4) Mammogram order

## 2023-09-04 NOTE — Progress Notes (Addendum)
 09/04/2023 Name: April Harrison MRN: [id number] DOB: 1955-09-24  Chief Complaint  Patient presents with   Diabetes   Medication Management    April Harrison is a 68 y.o. year old female who was referred for medication management by their primary care provider, Trenda Frisk, FNP. They presented for a face to face visit today.   They were referred to the pharmacist by their PCP for assistance in managing diabetes, medication access, and complex medication management    Subjective:  Care Team: Primary Care Provider: Trenda Frisk, FNP ; Next Scheduled Visit: 09/28/23  Medication Access/Adherence  Current Pharmacy:  [facility] [address]), [place] - [address] [place]) [place] [address] Phone: [phone] Fax: [phone]  [facility] [address] [place] [address] Phone: [phone] Fax: [phone]  [facility] - [place], [place] - [address] [place] [address] Phone: [phone] Fax: [phone]   Patient reports affordability concerns with their medications: No  Patient reports access/transportation concerns to their pharmacy: Yes  Patient reports adherence concerns with their medications:  Yes  - states [facility] does not fill things when she needs them   Diabetes:  Current medications: Trijardy  XR 25-5-1000mg  once daily, Mounjaro  7.5mg  once weekly, Tresiba 65 units every evening Medications tried in the past: Trulicity, Rybelsus  Current glucose readings: Has not been checking routinely, ran out of sensors when her insurance changed    Observed patterns:  Patient denies hypoglycemic s/sx including dizziness, shakiness, sweating. Patient denies hyperglycemic symptoms including polyuria, polydipsia, polyphagia, nocturia, neuropathy, blurred vision.  Current physical activity: Limited due to back  pain  Current medication access support: None  Hypertension:  Current medications: Lisinopril -hydrochlorothiazide  20-25mg  daily, Lasix  40mg  daily (skips if she has to leave her house) Medications previously tried: Metoprolol (not taking-ran out of refills and did not request)  Patient does not have a validated, automated, upper arm home BP cuff Current blood pressure readings readings: not checking  Patient denies hypotensive s/sx including dizziness, lightheadedness.  Patient denies hypertensive symptoms including headache, chest pain, shortness of breath    Hyperlipidemia/ASCVD Risk Reduction  Current lipid lowering medications: None Medications tried in the past: Atorvastatin 10mg  - did not like how it made her feel (unable to describe), not interested in taking anything for cholesterol at this time  Antiplatelet regimen: Aspirin 81mg    Medication Management:  Current adherence strategy: None  Patient reports Fair adherence to medications  Patient reports the following barriers to adherence: transport, 30 day supply refills instead of 90     Objective:  Lab Results  Component Value Date   HGBA1C 6.4 (H) 08/15/2023    Lab Results  Component Value Date   CREATININE 0.76 08/15/2023   BUN 14 08/15/2023   NA 145 (H) 08/15/2023   K 4.1 08/15/2023   CL 103 08/15/2023   CO2 25 08/15/2023    Lab Results  Component Value Date   CHOL 186 08/15/2023   HDL 66 08/15/2023   LDLCALC 64 08/15/2023   TRIG 368 (H) 08/15/2023   CHOLHDL 2.8 08/15/2023    Medications Reviewed Today     Reviewed by Carnell Christian, RPH (Pharmacist) on 09/04/23 at 0825  Med List Status: <None>   Medication Order Taking? Sig Documenting Provider Last Dose Status Informant  albuterol  (VENTOLIN  HFA) 108 (90 Base) MCG/ACT inhaler [id number] Yes Inhale 2 puffs into the  lungs every 6 (six) hours as needed for wheezing or shortness of breath. Ruth Cove, MD Taking Active   aspirin 81  MG tablet [id number] Yes Take 81 mg by mouth daily.   [provider] Taking Active Self  atorvastatin (LIPITOR) 10 MG tablet [id number] Yes Take 10 mg by mouth daily. [provider] Taking Active Self  Calcium Carb-Cholecalciferol (CALCIUM/VITAMIN D ) 600-400 MG-UNIT TABS [id number] Yes Take 1 tablet by mouth daily. [provider] Taking Active Self  Empagliflozin-Linaglip-Metform (TRIJARDY  XR) 25-09-998 MG TB24 [id number] Yes Take 1 tablet by mouth daily. Trenda Frisk, FNP Taking Active   fexofenadine  (ALLEGRA ) 180 MG tablet [id number] Yes Take 1 tablet (180 mg total) by mouth daily. Trenda Frisk, FNP Taking Active   Fluticasone-Umeclidin-Vilant (TRELEGY ELLIPTA) 100-62.5-25 MCG/INH AEPB [id number]  Inhale 1 puff into the lungs daily. [provider]  Active   furosemide  (LASIX ) 40 MG tablet [id number]  Take 1 tablet (40 mg total) by mouth daily. Trenda Frisk, FNP  Active   glucose blood test strip [id number]  1 each by Other route as needed. Use as instructed  [provider]  Active Self  lisinopril -hydrochlorothiazide  (ZESTORETIC ) 20-25 MG tablet [id number] Yes Take 1 tablet by mouth daily. Trenda Frisk, FNP Taking Active   metoprolol tartrate (LOPRESSOR) 25 MG tablet [id number] No TAKE 1 TABLET BY MOUTH TWICE  DAILY  Patient not taking: Reported on 09/04/2023   Trenda Frisk, FNP Not Taking Active   Multiple Vitamins-Minerals (MULTIVITAMIN WITH MINERALS) tablet [id number] Yes Take 1 tablet by mouth daily. [provider] Taking Active Self  PARoxetine  (PAXIL ) 20 MG tablet [id number] Yes Take 1 tablet (20 mg total) by mouth daily. Trenda Frisk, FNP Taking Active   tirzepatide  (MOUNJARO ) 7.5 MG/0.5ML Pen [id number] Yes Inject 7.5 mg into the skin once a week. Trenda Frisk, FNP Taking Active   TRESIBA FLEXTOUCH 100 UNIT/ML FlexTouch Pen [id number] Yes INJECT 65 UNITS INTO SKIN ONCE EVERY EVENING. Trenda Frisk, FNP Taking  Active   zinc gluconate 50 MG tablet [id number] Yes Take 50 mg by mouth daily. [provider] Taking Active Self              Assessment/Plan:   Diabetes: - Currently controlled - Reviewed long term cardiovascular and renal outcomes of uncontrolled blood sugar - Reviewed goal A1c, goal fasting, and goal 2 hour post prandial glucose - Reviewed dietary modifications including low carb diet - Recommend to continue current medication therapy  - Patient denies personal or family history of multiple endocrine neoplasia type 2, medullary thyroid  cancer; personal history of pancreatitis or gallbladder disease. - Recommend to check glucose daily, following up on order for CGM supplies  Hypertension: - Currently controlled - Reviewed long term cardiovascular and renal outcomes of uncontrolled blood pressure - Reviewed appropriate blood pressure monitoring technique and reviewed goal blood pressure. Recommended to check home blood pressure and heart rate once weekly - Recommend to continue current medication therapy    Hyperlipidemia/ASCVD Risk Reduction: - Currently controlled.  - Reviewed long term complications of uncontrolled cholesterol - Recommend to restart atorvastatin 10mg  or try another statin due to comorbid T2DM, patient declines    Medication Management: - Currently strategy insufficient to maintain appropriate adherence to prescribed medication regimen - Suggested use of weekly pill box to organize medications - Created list of medication, indication, and administration time. Provided to patient - Discussed collaboration with local pharmacies for delivery. Patient elects to switch medications to [facility]  to utilize their free delivery services, will coordinate refills with PCP  Patient also wants to know if order for incontinence supplies can be placed, and states she never received a refill for her CGMs from company, will notify PCP of request for incontinence  supplies and follow up on CGM order with [facility].    Follow Up Plan: 1 week to check back in with patient on refills  Carnell Christian, PharmD Clinical Pharmacist [phone]

## 2023-09-05 ENCOUNTER — Telehealth: Payer: Self-pay

## 2023-09-05 NOTE — Progress Notes (Signed)
   09/05/2023  Patient ID: April Harrison, female   DOB: October 23, 1955, 68 y.o.   MRN: [id number]  Attempted to contact patient and had to leave a voicemail.  Need to discuss the following: [facility] has cancelled her order for CGM [place] devices until she contacts them directly at [phone] and asks to speak to pharmacy department. They tried numerous times to reach her with no success.   Also wanted to follow up on her request to switch to [facility] for delivery rx services. Pharmacy will need to be able to reach her by phone to confirm deliveries and payment, unsure this will be doable. Will hold off on coordinating pharmacy transition until I am able to reach patient.  Will continue to attempt to reach.  Carnell Christian, PharmD Clinical Pharmacist [phone]

## 2023-09-14 NOTE — Progress Notes (Signed)
 Referring Physician:  Trenda Frisk, FNP [address] [place],  [place] [address]  Primary Physician:  Trenda Frisk, FNP  History of Present Illness: 09/18/2023 [name] is here today with a chief complaint of back and bilateral leg pain.  She does have a history of scoliosis as well as rheumatoid arthritis.  She states she has longstanding back pain however over the past couple of months it has become worse.  She is quite concerned because she feels as though she is also becoming weak and her balance is off.  She recently fell down an escalator due to balance issues.  She often finds her self having to lean against walls or lean forward in order to walk.  She states that she can only walk about a block and has to stop secondary to her back pain.  She denies any radiation down her arms or her legs.  She does have Flexeril, but it makes her drowsy.  She has mostly been taking Tylenol  as she was previously warned that she was taking too many NSAIDs.  No saddle anesthesia.  No incontinence of bowel or bladder.  Bowel/Bladder Dysfunction: none  Conservative measures:  Physical therapy:  has not participated in PT recently Multimodal medical therapy including regular antiinflammatories:  none Injections:  no epidural steroid injections  Past Surgery: no spinal surgeries   Tammie A Merlo potentially symptoms of cervical myelopathy given neck pain with associated balance issues and difficulty with gait.  The symptoms are causing a significant impact on the patient's life.   Review of Systems:  A 10 point review of systems is negative, except for the pertinent positives and negatives detailed in the HPI.  Past Medical History: Past Medical History:  Diagnosis Date   Anxiety    Arthritis    Depression    Diabetes mellitus    Type 2   Family history of adverse reaction to anesthesia    sister almost died   History of chicken pox    Hypertension    Measles    Mumps     Osteoarthritis of carpometacarpal (CMC) joint of thumb 01/06/2022   Pain in right hand 01/06/2022   Pain of left hand 01/06/2022   Pancreatitis    Sleep apnea    told that she doesn't have it now   Stiffness of finger joint of left hand 02/03/2022    Past Surgical History: Past Surgical History:  Procedure Laterality Date   CESAREAN SECTION     COLONOSCOPY WITH PROPOFOL  N/A 07/21/2017   Procedure: COLONOSCOPY WITH PROPOFOL ;  Surgeon: Selena Daily, MD;  Location: [facility]COPY;  Service: Gastroenterology;  Laterality: N/A;   HARDWARE REMOVAL Left    knee   HYSTEROSCOPY WITH D & C N/A 08/29/2019   Procedure: DILATATION AND CURETTAGE /HYSTEROSCOPY & MYOSURE POYLPECTOMY AND MYOMECTOMY;  Surgeon: Darl Edu, MD;  Location: [facility] ORS;  Service: Gynecology;  Laterality: N/A;   KNEE SURGERY Left     Allergies: Allergies as of 09/18/2023 - Review Complete 09/18/2023  Allergen Reaction Noted   Penicillin g  02/17/2019   Gabapentin Rash 08/12/2017    Medications: Outpatient Encounter Medications as of 09/18/2023  Medication Sig   acetaminophen  (TYLENOL ) 325 MG tablet Take 650 mg by mouth every 6 (six) hours as needed.   albuterol  (VENTOLIN  HFA) 108 (90 Base) MCG/ACT inhaler Inhale 2 puffs into the lungs every 6 (six) hours as needed for wheezing or shortness of breath.   aspirin 81 MG  tablet Take 81 mg by mouth daily.     Calcium Carb-Cholecalciferol (CALCIUM/VITAMIN D ) 600-400 MG-UNIT TABS Take 1 tablet by mouth daily.   Empagliflozin-Linaglip-Metform (TRIJARDY  XR) 25-09-998 MG TB24 Take 1 tablet by mouth daily.   fexofenadine  (ALLEGRA ) 180 MG tablet Take 1 tablet (180 mg total) by mouth daily.   Fluticasone-Umeclidin-Vilant (TRELEGY ELLIPTA) 100-62.5-25 MCG/INH AEPB Inhale 1 puff into the lungs daily.   furosemide  (LASIX ) 40 MG tablet Take 1 tablet (40 mg total) by mouth daily.   glucose blood test strip 1 each by Other route as needed. Use as instructed     lisinopril -hydrochlorothiazide  (ZESTORETIC ) 20-25 MG tablet Take 1 tablet by mouth daily.   metoprolol tartrate (LOPRESSOR) 25 MG tablet TAKE 1 TABLET BY MOUTH TWICE  DAILY   Multiple Vitamins-Minerals (MULTIVITAMIN WITH MINERALS) tablet Take 1 tablet by mouth daily.   PARoxetine  (PAXIL ) 20 MG tablet Take 1 tablet (20 mg total) by mouth daily.   tirzepatide  (MOUNJARO ) 7.5 MG/0.5ML Pen Inject 7.5 mg into the skin once a week.   TRESIBA FLEXTOUCH 100 UNIT/ML FlexTouch Pen INJECT 65 UNITS INTO SKIN ONCE EVERY EVENING.   zinc gluconate 50 MG tablet Take 50 mg by mouth daily.   No facility-administered encounter medications on file as of 09/18/2023.    Social History: Social History   Tobacco Use   Smoking status: Never   Smokeless tobacco: Never  Vaping Use   Vaping status: Never Used  Substance Use Topics   Alcohol use: No   Drug use: No    Family Medical History: Family History  Problem Relation Age of Onset   Breast cancer Neg Hx     Physical Examination: @VITALWITHPAIN @  General: Patient is well developed, well nourished, calm, collected, and in no apparent distress. Attention to examination is appropriate.  Psychiatric: Patient is non-anxious.  Head:  Pupils equal, round, and reactive to light.  ENT:  Oral mucosa appears well hydrated.  Neck:   Supple.  Full range of motion.  Respiratory: Patient is breathing without any difficulty.  Extremities: No edema.  Vascular: Palpable dorsal pedal pulses.  Skin:   On exposed skin, there are no abnormal skin lesions.  NEUROLOGICAL:     Awake, alert, oriented to person, place, and time.  Speech is clear and fluent. Fund of knowledge is appropriate.   Cranial Nerves: Pupils equal round and reactive to light.  Facial tone is symmetric.   ROM of spine: Difficulty with range of motion.  Some tenderness palpation of her lumbar and cervical paraspinals.  Strength: Side Biceps Triceps Deltoid Interossei Grip Wrist Ext. Wrist  Flex.  R 5 5 5 4  4+ 4+ 5  L 5 5 5 4  4+ 4+ 5   Side Iliopsoas Quads Hamstring PF DF EHL  R 5 5 5 5 5 5   L 5 5 5 5 5 5    Reflexes are 2+ and symmetric at the biceps, triceps, brachioradialis, patella and achilles.   Hoffman's is absent.  Clonus is not present.  Toes are down-going.  Bilateral upper and lower extremity sensation is intact to light touch.    Patient has some difficulty with tandem gait.  Medical Decision Making  Imaging: Cervical spine xrays 08/17/23: FINDINGS: No fracture, dislocation or subluxation. No spondylolisthesis. No osteolytic or osteoblastic changes. Prevertebral and cervical cranial soft tissues are unremarkable.   Degenerative disc disease noted with disc space narrowing and marginal osteophytes at C4-T1.   IMPRESSION: Degenerative changes. No acute osseous abnormalities.    Lumbar  spine:  FINDINGS: Multilevel degenerative disc disease with narrowing T12-L1, L1-L2 L3-L4, L4-L5 disc spaces with diffuse anterior osteophytes and mild posterior bony spondylosis.   There is advanced degenerative facet disease  L3-L4 L4-5 and L5-S1   No spondylolysis or spondylolisthesis.   No obvious fractures   IMPRESSION: Multilevel degenerative disc disease and facet disease.    CT cervical spine 07/27/23:  EXAM: CT CERVICAL SPINE WITHOUT CONTRAST   TECHNIQUE: Multidetector CT imaging of the cervical spine was performed without intravenous contrast. Multiplanar CT image reconstructions were also generated.   RADIATION DOSE REDUCTION: This exam was performed according to the departmental dose-optimization program which includes automated exposure control, adjustment of the mA and/or kV according to patient size and/or use of iterative reconstruction technique.   COMPARISON:  None Available.   FINDINGS: Alignment: Alignment is anatomic.   Skull base and vertebrae: No acute fracture. No primary bone lesion or focal pathologic process.   Soft  tissues and spinal canal: No prevertebral fluid or swelling. No visible canal hematoma.   Disc levels: There is diffuse spondylosis and facet hypertrophy, most pronounced from C4-5 through C7-T1.   Upper chest: Airway is patent. Visualized portions of the lung apices are clear.   Other: Reconstructed images demonstrate no additional findings.   IMPRESSION: 1. No acute cervical spine fracture. 2. Extensive multilevel cervical spondylosis and facet hypertrophy.     I have personally reviewed the images and agree with the above interpretation.  Assessment and Plan: Ms. Loehrer is a pleasant 68 y.o. female is here today with a chief complaint of back and bilateral leg pain.  She does have a history of scoliosis as well as rheumatoid arthritis.  She states she has longstanding back pain however over the past couple of months it has become worse.  She is quite concerned because she feels as though she is also becoming weak and her balance is off.  She recently fell down an escalator due to balance issues.  She often finds her self having to lean against walls or lean forward in order to walk.  She states that she can only walk about a block and has to stop secondary to her back pain.  She denies any radiation down her arms or legs.  She does have Flexeril, but it makes her drowsy.  She has mostly been taking Tylenol  as she was previously warned that she was taking too many NSAIDs.  No saddle anesthesia.  No incontinence of bowel or bladder.  On examination she does have tenderness palpation of her lumbar and cervicals paraspinals.  She has difficulty with range of motion of both.  She is largely full strength throughout with exception of her distal upper extremities having some slight weakness.  Patient also has some difficulty with tandem gait.  Previous x-rays and CT reviewed.  Her cervical spine does have multilevel cervical spondylosis and extensive facet hypertrophy.  There is also multilevel  degenerative disc disease narrowing throughout her lumbar spine.  It was a pleasure to see patient in clinic today.  Would like patient to undergo scoliosis films today with cervical and lumbar films including flexion and extension to evaluate for listhesis.  I am concerned that patient's balance issues and weakness could be related to her spine.  She has fallen twice due to this.  Would like her to undergo MRI of her cervical and lumbar spine for further evaluation.  In the meantime I would like her to undergo formal physical therapy and a referral  is in place for this.  Will review results once they are complete.  Plan to see back in the next 6 to 8 weeks.  Patient is likely suffering from neurogenic claudication and extensive degenerative disc changes, however want to rule out myelopathy or any cord signal changes.    Thank you for involving me in the care of this patient.   I spent a total of 60 minutes in both face-to-face and non-face-to-face activities for this visit on the date of this encounter including preparing to see the patient, obtaining and reviewing separately obtained history, performing medically appropriate examination, counseling the patient, ordering additional tests, making referrals, documenting clinical information, independently interpreting results, and care coordination.  Ludwig Safer, PA-C Dept. of Neurosurgery

## 2023-09-14 NOTE — Progress Notes (Signed)
 Patient notified

## 2023-09-18 ENCOUNTER — Ambulatory Visit
Admission: RE | Admit: 2023-09-18 | Discharge: 2023-09-18 | Disposition: A | Discharge status: Home / Self Care | Attending: Physician Assistant | Admitting: Physician Assistant

## 2023-09-18 ENCOUNTER — Ambulatory Visit
Admission: RE | Admit: 2023-09-18 | Discharge: 2023-09-18 | Disposition: A | Discharge status: Home / Self Care | Source: Ambulatory Visit | Attending: Physician Assistant | Admitting: Physician Assistant

## 2023-09-18 ENCOUNTER — Encounter: Payer: Self-pay | Admitting: Physician Assistant

## 2023-09-18 ENCOUNTER — Other Ambulatory Visit: Payer: Self-pay | Admitting: Physician Assistant

## 2023-09-18 ENCOUNTER — Ambulatory Visit (INDEPENDENT_AMBULATORY_CARE_PROVIDER_SITE_OTHER): Admitting: Physician Assistant

## 2023-09-18 VITALS — BP 142/82 | Ht 59.0 in | Wt 260.0 lb

## 2023-09-18 DIAGNOSIS — M545 Low back pain, unspecified: Secondary | ICD-10-CM | POA: Insufficient documentation

## 2023-09-18 DIAGNOSIS — M542 Cervicalgia: Secondary | ICD-10-CM

## 2023-09-18 DIAGNOSIS — M4120 Other idiopathic scoliosis, site unspecified: Secondary | ICD-10-CM

## 2023-09-18 DIAGNOSIS — G8929 Other chronic pain: Secondary | ICD-10-CM

## 2023-09-18 DIAGNOSIS — M47812 Spondylosis without myelopathy or radiculopathy, cervical region: Secondary | ICD-10-CM | POA: Diagnosis not present

## 2023-09-18 DIAGNOSIS — M4316 Spondylolisthesis, lumbar region: Secondary | ICD-10-CM | POA: Diagnosis not present

## 2023-09-18 DIAGNOSIS — M47816 Spondylosis without myelopathy or radiculopathy, lumbar region: Secondary | ICD-10-CM | POA: Diagnosis not present

## 2023-09-18 DIAGNOSIS — M48062 Spinal stenosis, lumbar region with neurogenic claudication: Secondary | ICD-10-CM

## 2023-09-18 DIAGNOSIS — M4186 Other forms of scoliosis, lumbar region: Secondary | ICD-10-CM | POA: Diagnosis not present

## 2023-09-18 DIAGNOSIS — M51369 Other intervertebral disc degeneration, lumbar region without mention of lumbar back pain or lower extremity pain: Secondary | ICD-10-CM | POA: Diagnosis not present

## 2023-09-24 ENCOUNTER — Other Ambulatory Visit: Payer: Self-pay | Admitting: Family

## 2023-09-28 ENCOUNTER — Ambulatory Visit (INDEPENDENT_AMBULATORY_CARE_PROVIDER_SITE_OTHER): Admitting: Family

## 2023-09-28 ENCOUNTER — Encounter: Payer: Self-pay | Admitting: Family

## 2023-09-28 VITALS — BP 130/68 | HR 59 | Ht 59.0 in | Wt 257.0 lb

## 2023-09-28 DIAGNOSIS — Z0130 Encounter for examination of blood pressure without abnormal findings: Secondary | ICD-10-CM

## 2023-09-28 DIAGNOSIS — F322 Major depressive disorder, single episode, severe without psychotic features: Secondary | ICD-10-CM | POA: Diagnosis not present

## 2023-09-28 DIAGNOSIS — R413 Other amnesia: Secondary | ICD-10-CM

## 2023-09-28 DIAGNOSIS — R7303 Prediabetes: Secondary | ICD-10-CM

## 2023-09-28 MED ORDER — MOUNJARO 10 MG/0.5ML ~~LOC~~ SOAJ: 10.0000 mg | SUBCUTANEOUS | 3 refills | Status: DC

## 2023-09-28 MED ORDER — NYSTATIN 100000 UNIT/GM EX OINT: 1.0000 | TOPICAL_OINTMENT | Freq: Two times a day (BID) | CUTANEOUS | 0 refills | Status: DC

## 2023-09-28 NOTE — Telephone Encounter (Signed)
 Per [facility] verbal she has already addressed this with pt at appt

## 2023-09-28 NOTE — Patient Instructions (Signed)
 Eye Doctor -  [phone]  ENT -  [phone]  Physical Therapy -  [phone]  [facility] - For the [facility] meter for your blood sugars -  Call [phone]  ask for the pharmacy department.

## 2023-09-28 NOTE — Progress Notes (Signed)
 Established Patient Office Visit  Subjective:  Patient ID: April Harrison, female    DOB: 01-08-1956  Age: 68 y.o. MRN: [id number]  Chief Complaint  Patient presents with   Follow-up    1 month follow up    Patient is here today for her 1 month follow up.  She has been feeling poorly since last appointment.   She does have additional concerns to discuss today.  She still needs referral to neurology for her cognitive impairment and personality changes.  She has also been having significant depression, says that she  Labs are not due today. She needs refills.   I have reviewed her active problem list, medication list, allergies, notes from last encounter, lab results for her appointment today.      No other concerns at this time.   Past Medical History:  Diagnosis Date   Anxiety    Arthritis    Depression    Diabetes mellitus    Type 2   Family history of adverse reaction to anesthesia    sister almost died   History of chicken pox    Hypertension    Measles    Mumps    Osteoarthritis of carpometacarpal (CMC) joint of thumb 01/06/2022   Pain in right hand 01/06/2022   Pain of left hand 01/06/2022   Pancreatitis    Sleep apnea    told that she doesn't have it now   Stiffness of finger joint of left hand 02/03/2022    Past Surgical History:  Procedure Laterality Date   CESAREAN SECTION     COLONOSCOPY WITH PROPOFOL  N/A 07/21/2017   Procedure: COLONOSCOPY WITH PROPOFOL ;  Surgeon: Unk Corinn Skiff, MD;  Location: [facility] END[facility]COPY;  Service: Gastroenterology;  Laterality: N/A;   HARDWARE REMOVAL Left    knee   HYSTEROSCOPY WITH D & C N/A 08/29/2019   Procedure: DILATATION AND CURETTAGE /HYSTEROSCOPY & MYOSURE POYLPECTOMY AND MYOMECTOMY;  Surgeon: Lake Read, MD;  Location: [facility] ORS;  Service: Gynecology;  Laterality: N/A;   KNEE SURGERY Left     Social History   Socioeconomic History   Marital status: Single    Spouse name: Not on file   Number of  children: Not on file   Years of education: Not on file   Highest education level: Not on file  Occupational History   Not on file  Tobacco Use   Smoking status: Never   Smokeless tobacco: Never  Vaping Use   Vaping status: Never Used  Substance and Sexual Activity   Alcohol use: No   Drug use: No   Sexual activity: Not Currently    Birth control/protection: None  Other Topics Concern   Not on file  Social History Narrative   ** Merged History Encounter **       Social Drivers of Corporate investment banker Strain: Not on file  Food Insecurity: Not on file  Transportation Needs: Not on file  Physical Activity: Not on file  Stress: Not on file  Social Connections: Not on file  Intimate Partner Violence: Not on file    Family History  Problem Relation Age of Onset   Breast cancer Neg Hx     Allergies  Allergen Reactions   Penicillin G     Other reaction(s): Unknown   Gabapentin Rash    Review of Systems  Constitutional:  Positive for malaise/fatigue.  Neurological:  Positive for dizziness and headaches.  Psychiatric/Behavioral:  Positive for depression and memory loss.  All other systems reviewed and are negative.      Objective:   BP 130/68   Pulse (!) 59   Ht 4' 11 (1.499 m)   Wt 257 lb (116.6 kg)   SpO2 95%   BMI 51.91 kg/m   Vitals:   09/28/23 0911  BP: 130/68  Pulse: (!) 59  Height: 4' 11 (1.499 m)  Weight: 257 lb (116.6 kg)  SpO2: 95%  BMI (Calculated): 51.88    Physical Exam Vitals and nursing note reviewed.  Constitutional:      Appearance: Normal appearance. She is obese.  HENT:     Head: Normocephalic.   Eyes:     Extraocular Movements: Extraocular movements intact.     Conjunctiva/sclera: Conjunctivae normal.     Pupils: Pupils are equal, round, and reactive to light.    Cardiovascular:     Rate and Rhythm: Normal rate.     Pulses: Normal pulses.     Heart sounds: Normal heart sounds.  Pulmonary:     Effort:  Pulmonary effort is normal.     Breath sounds: Normal breath sounds.   Musculoskeletal:        General: Normal range of motion.   Neurological:     General: No focal deficit present.     Mental Status: She is alert and oriented to person, place, and time. Mental status is at baseline.   Psychiatric:        Attention and Perception: Attention and perception normal.        Mood and Affect: Mood is depressed. Affect is flat.        Speech: Speech normal.        Behavior: Behavior is withdrawn. Behavior is cooperative.        Thought Content: Thought content normal.        Cognition and Memory: Cognition is impaired. Memory is impaired.        Judgment: Judgment is impulsive and inappropriate.      No results found for any visits on 09/28/23.  Recent Results (from the past 2160 hours)  Lipid panel     Status: Abnormal   Collection Time: 08/15/23  2:33 PM  Result Value Ref Range   Cholesterol, Total 186 100 - 199 mg/dL   Triglycerides 631 (H) 0 - 149 mg/dL   HDL 66 >60 mg/dL   VLDL Cholesterol Cal 56 (H) 5 - 40 mg/dL   LDL Chol Calc (NIH) 64 0 - 99 mg/dL   Chol/HDL Ratio 2.8 0.0 - 4.4 ratio    Comment:                                   T. Chol/HDL Ratio                                             Men  Women                               1/2 Avg.Risk  3.4    3.3                                   Avg.Risk  5.0  4.4                                2X Avg.Risk  9.6    7.1                                3X Avg.Risk 23.4   11.0   VITAMIN D  25 Hydroxy (Vit-D Deficiency, Fractures)     Status: None   Collection Time: 08/15/23  2:33 PM  Result Value Ref Range   Vit D, 25-Hydroxy 31.2 30.0 - 100.0 ng/mL    Comment: Vitamin D  deficiency has been defined by the Institute of Medicine and an Endocrine Society practice guideline as a level of serum 25-OH vitamin D  less than 20 ng/mL (1,2). The Endocrine Society went on to further define vitamin D  insufficiency as a level between 21 and  29 ng/mL (2). 1. IOM (Institute of Medicine). 2010. Dietary reference    intakes for calcium and D. [place]: The    [employer]. 2. Holick MF, Binkley [place], Bischoff-Ferrari HA, et al.    Evaluation, treatment, and prevention of vitamin D     deficiency: an Endocrine Society clinical practice    guideline. JCEM. 2011 Jul; 96(7):1911-30.   CMP14+EGFR     Status: Abnormal   Collection Time: 08/15/23  2:33 PM  Result Value Ref Range   Glucose 130 (H) 70 - 99 mg/dL   BUN 14 8 - 27 mg/dL   Creatinine, Ser 9.23 0.57 - 1.00 mg/dL   eGFR 86 >40 fO/fpw/8.26   BUN/Creatinine Ratio 18 12 - 28   Sodium 145 (H) 134 - 144 mmol/L   Potassium 4.1 3.5 - 5.2 mmol/L   Chloride 103 96 - 106 mmol/L   CO2 25 20 - 29 mmol/L   Calcium 9.6 8.7 - 10.3 mg/dL   Total Protein 6.8 6.0 - 8.5 g/dL   Albumin 4.0 3.9 - 4.9 g/dL   Globulin, Total 2.8 1.5 - 4.5 g/dL   Bilirubin Total 0.4 0.0 - 1.2 mg/dL   Alkaline Phosphatase 120 44 - 121 IU/L   AST 24 0 - 40 IU/L   ALT 16 0 - 32 IU/L  TSH     Status: None   Collection Time: 08/15/23  2:33 PM  Result Value Ref Range   TSH 0.832 0.450 - 4.500 uIU/mL  Hemoglobin A1c     Status: Abnormal   Collection Time: 08/15/23  2:33 PM  Result Value Ref Range   Hgb A1c MFr Bld 6.4 (H) 4.8 - 5.6 %    Comment:          Prediabetes: 5.7 - 6.4          Diabetes: >6.4          Glycemic control for adults with diabetes: <7.0    Est. average glucose Bld gHb Est-mCnc 137 mg/dL  Vitamin B12     Status: None   Collection Time: 08/15/23  2:33 PM  Result Value Ref Range   Vitamin B-12 835 232 - 1,245 pg/mL  CBC with Diff     Status: None   Collection Time: 08/15/23  2:33 PM  Result Value Ref Range   WBC 8.3 3.4 - 10.8 x10E3/uL   RBC 4.60 3.77 - 5.28 x10E6/uL   Hemoglobin 12.5 11.1 - 15.9 g/dL   Hematocrit 62.0 65.9 - 46.6 %  MCV 82 79 - 97 fL   MCH 27.2 26.6 - 33.0 pg   MCHC 33.0 31.5 - 35.7 g/dL   RDW 85.0 88.2 - 84.5 %   Platelets 174 150 - 450 x10E3/uL    Neutrophils 71 Not Estab. %   Lymphs 15 Not Estab. %   Monocytes 8 Not Estab. %   Eos 5 Not Estab. %   Basos 1 Not Estab. %   Neutrophils Absolute 5.9 1.4 - 7.0 x10E3/uL   Lymphocytes Absolute 1.3 0.7 - 3.1 x10E3/uL   Monocytes Absolute 0.7 0.1 - 0.9 x10E3/uL   EOS (ABSOLUTE) 0.4 0.0 - 0.4 x10E3/uL   Basophils Absolute 0.1 0.0 - 0.2 x10E3/uL   Immature Granulocytes 0 Not Estab. %   Immature Grans (Abs) 0.0 0.0 - 0.1 x10E3/uL  Iron, TIBC and Ferritin Panel     Status: None   Collection Time: 08/15/23  2:33 PM  Result Value Ref Range   Total Iron Binding Capacity 302 250 - 450 ug/dL   UIBC 776 881 - 630 ug/dL   Iron 79 27 - 860 ug/dL   Iron Saturation 26 15 - 55 %   Ferritin 108 15 - 150 ng/mL       Assessment & Plan Memory changes Memory loss, long term Sending referral to Neuro for pt.  Will defer to them for further treatment/decisions.  Moderately severe major depression (HCC) Will change her medications and reassess at follow up.  Consider sending to Psychiatry if not improving.    Return in about 1 month (around 10/29/2023).   Total time spent: 20 minutes  ALAN CHRISTELLA ARRANT, FNP  09/28/2023   This document may have been prepared by [facility] Voice Recognition software and as such may include unintentional dictation errors.

## 2023-10-16 DIAGNOSIS — M5030 Other cervical disc degeneration, unspecified cervical region: Secondary | ICD-10-CM | POA: Diagnosis not present

## 2023-10-16 DIAGNOSIS — M5459 Other low back pain: Secondary | ICD-10-CM | POA: Diagnosis not present

## 2023-10-19 DIAGNOSIS — M5459 Other low back pain: Secondary | ICD-10-CM | POA: Diagnosis not present

## 2023-10-19 DIAGNOSIS — M5030 Other cervical disc degeneration, unspecified cervical region: Secondary | ICD-10-CM | POA: Diagnosis not present

## 2023-10-27 DIAGNOSIS — M5030 Other cervical disc degeneration, unspecified cervical region: Secondary | ICD-10-CM | POA: Diagnosis not present

## 2023-10-27 DIAGNOSIS — M5459 Other low back pain: Secondary | ICD-10-CM | POA: Diagnosis not present

## 2023-10-31 DIAGNOSIS — M5459 Other low back pain: Secondary | ICD-10-CM | POA: Diagnosis not present

## 2023-10-31 DIAGNOSIS — M5030 Other cervical disc degeneration, unspecified cervical region: Secondary | ICD-10-CM | POA: Diagnosis not present

## 2023-11-05 ENCOUNTER — Encounter: Payer: Self-pay | Admitting: Family

## 2023-11-05 DIAGNOSIS — R413 Other amnesia: Secondary | ICD-10-CM | POA: Insufficient documentation

## 2023-11-05 NOTE — Assessment & Plan Note (Signed)
 Checking labs today.  Continue current therapy for lipid control. Will modify as needed based on labwork results.   -CMP w/eGFR -Lipid Panel

## 2023-11-05 NOTE — Assessment & Plan Note (Signed)
 Blood pressure well controlled with current medications.  Continue current therapy.  Will reassess at follow up.   - CBC w/Diff - CMP w/eGFR

## 2023-11-05 NOTE — Assessment & Plan Note (Signed)
 Continue current meds.  Will adjust as needed based on results.  The patient is asked to make an attempt to improve diet and exercise patterns to aid in medical management of this problem. Addressed importance of increasing and maintaining water intake.

## 2023-11-05 NOTE — Assessment & Plan Note (Signed)
 Sending referral to Neuro for pt.  Will defer to them for further treatment/decisions.

## 2023-11-05 NOTE — Assessment & Plan Note (Signed)
 Patient stable.  Well controlled with current therapy.   Continue current meds.

## 2023-11-05 NOTE — Assessment & Plan Note (Signed)
 Continue CPAP.  Patient has been using and reports that she is benefiting from therapy.

## 2023-11-05 NOTE — Assessment & Plan Note (Signed)
 Will change her medications and reassess at follow up.  Consider sending to Psychiatry if not improving.

## 2023-11-05 NOTE — Assessment & Plan Note (Signed)
 Xrays of neck/lumbar spine ordered today.  Will call pt with results when available.

## 2023-11-05 NOTE — Assessment & Plan Note (Signed)
 Checking labs today.  Will continue supplements as needed.   - Vitamin D  - Vitamin B12 - TSH

## 2023-11-05 NOTE — Assessment & Plan Note (Signed)
 Checking labs today. Will call pt. With results  Continue current diabetes POC, as patient has been well controlled on current regimen.  Will adjust meds if needed based on labs.   -CBC w/Diff -CMP w/eGFR -Hemoglobin A1C

## 2023-11-06 ENCOUNTER — Ambulatory Visit: Admitting: Physician Assistant

## 2023-11-07 ENCOUNTER — Ambulatory Visit: Admitting: Physician Assistant

## 2023-11-14 ENCOUNTER — Ambulatory Visit
Admission: RE | Admit: 2023-11-14 | Discharge: 2023-11-14 | Disposition: A | Discharge status: Home / Self Care | Source: Ambulatory Visit | Attending: Physician Assistant | Admitting: Physician Assistant

## 2023-11-14 ENCOUNTER — Other Ambulatory Visit

## 2023-11-14 ENCOUNTER — Ambulatory Visit: Admitting: Family

## 2023-11-14 ENCOUNTER — Encounter: Payer: Self-pay | Admitting: Family

## 2023-11-14 VITALS — BP 110/76 | HR 70 | Ht 59.0 in | Wt 250.0 lb

## 2023-11-14 DIAGNOSIS — M4120 Other idiopathic scoliosis, site unspecified: Secondary | ICD-10-CM | POA: Diagnosis not present

## 2023-11-14 DIAGNOSIS — G8929 Other chronic pain: Secondary | ICD-10-CM | POA: Diagnosis not present

## 2023-11-14 DIAGNOSIS — M50223 Other cervical disc displacement at C6-C7 level: Secondary | ICD-10-CM | POA: Diagnosis not present

## 2023-11-14 DIAGNOSIS — M48061 Spinal stenosis, lumbar region without neurogenic claudication: Secondary | ICD-10-CM | POA: Diagnosis not present

## 2023-11-14 DIAGNOSIS — M47816 Spondylosis without myelopathy or radiculopathy, lumbar region: Secondary | ICD-10-CM | POA: Diagnosis not present

## 2023-11-14 DIAGNOSIS — M5021 Other cervical disc displacement,  high cervical region: Secondary | ICD-10-CM | POA: Diagnosis not present

## 2023-11-14 DIAGNOSIS — M542 Cervicalgia: Secondary | ICD-10-CM

## 2023-11-14 DIAGNOSIS — M4805 Spinal stenosis, thoracolumbar region: Secondary | ICD-10-CM | POA: Diagnosis not present

## 2023-11-14 DIAGNOSIS — Z794 Long term (current) use of insulin: Secondary | ICD-10-CM

## 2023-11-14 DIAGNOSIS — M545 Low back pain, unspecified: Secondary | ICD-10-CM | POA: Diagnosis not present

## 2023-11-14 DIAGNOSIS — E1159 Type 2 diabetes mellitus with other circulatory complications: Secondary | ICD-10-CM

## 2023-11-14 DIAGNOSIS — M5126 Other intervertebral disc displacement, lumbar region: Secondary | ICD-10-CM | POA: Diagnosis not present

## 2023-11-14 DIAGNOSIS — M4802 Spinal stenosis, cervical region: Secondary | ICD-10-CM | POA: Diagnosis not present

## 2023-11-14 DIAGNOSIS — B3789 Other sites of candidiasis: Secondary | ICD-10-CM

## 2023-11-14 DIAGNOSIS — M47812 Spondylosis without myelopathy or radiculopathy, cervical region: Secondary | ICD-10-CM | POA: Diagnosis not present

## 2023-11-14 LAB — POCT CBG (FASTING - GLUCOSE)-MANUAL ENTRY: Glucose Fasting, POC: 117 mg/dL — AB (ref 70–99)

## 2023-11-14 LAB — POC CREATINE & ALBUMIN,URINE
Albumin/Creatinine Ratio, Urine, POC: <30
Creatinine, POC: 100 mg/dL
Microalbumin Ur, POC: 30 mg/L

## 2023-11-14 MED ORDER — TRESIBA FLEXTOUCH 100 UNIT/ML ~~LOC~~ SOPN: 65.0000 [IU] | PEN_INJECTOR | Freq: Every day | SUBCUTANEOUS | 3 refills | Status: AC

## 2023-11-14 MED ORDER — ALLOPURINOL 100 MG PO TABS: 100.0000 mg | ORAL_TABLET | Freq: Every day | ORAL | 0 refills | Status: DC

## 2023-11-14 MED ORDER — TRESIBA FLEXTOUCH 100 UNIT/ML ~~LOC~~ SOPN: 65.0000 [IU] | PEN_INJECTOR | Freq: Every day | SUBCUTANEOUS | 3 refills | Status: DC

## 2023-11-14 MED ORDER — FREESTYLE LIBRE 3 PLUS SENSOR MISC: 3 refills | Status: AC

## 2023-11-14 MED ORDER — METOPROLOL SUCCINATE ER 50 MG PO TB24: 50.0000 mg | ORAL_TABLET | Freq: Every day | ORAL | 1 refills | Status: DC

## 2023-11-14 MED ORDER — LISINOPRIL-HYDROCHLOROTHIAZIDE 20-25 MG PO TABS: 1.0000 | ORAL_TABLET | Freq: Every day | ORAL | 1 refills | Status: DC

## 2023-11-14 MED ORDER — SERTRALINE HCL 100 MG PO TABS: 150.0000 mg | ORAL_TABLET | Freq: Every day | ORAL | 0 refills | Status: DC

## 2023-11-14 MED ORDER — TIRZEPATIDE 12.5 MG/0.5ML ~~LOC~~ SOAJ: 12.5000 mg | SUBCUTANEOUS | 0 refills | Status: DC

## 2023-11-14 MED ORDER — NYSTATIN 100000 UNIT/GM EX POWD: 1.0000 | Freq: Two times a day (BID) | CUTANEOUS | 0 refills | Status: DC

## 2023-11-14 MED ORDER — FUROSEMIDE 40 MG PO TABS: 40.0000 mg | ORAL_TABLET | Freq: Every day | ORAL | 1 refills | Status: DC

## 2023-11-14 MED ORDER — TRIJARDY XR 25-5-1000 MG PO TB24
1.0000 | ORAL_TABLET | Freq: Every day | ORAL | 1 refills | Status: DC
Start: 2023-11-14 — End: 2024-02-07

## 2023-11-14 NOTE — Progress Notes (Unsigned)
 Acute Office Visit  Subjective:     Patient ID: April Harrison, female    DOB: 09/06/1955, 68 y.o.   MRN: [id number]  Patient is in today for  Chief Complaint  Patient presents with  . Acute Visit    Pain under breasts    HPI   ROS      Objective:    BP 110/76   Pulse 70   Ht 4' 11 (1.499 m)   Wt 250 lb (113.4 kg)   SpO2 97%   BMI 50.49 kg/m   Physical Exam  Results for orders placed or performed in visit on 11/14/23  POCT CBG (Fasting - Glucose)  Result Value Ref Range   Glucose Fasting, POC 117 (A) 70 - 99 mg/dL    Recent Results (from the past 2160 hours)  POCT CBG (Fasting - Glucose)     Status: Abnormal   Collection Time: 11/14/23 10:53 AM  Result Value Ref Range   Glucose Fasting, POC 117 (A) 70 - 99 mg/dL    Allergies as of 06/09/7972       Reactions   Penicillin G    Other reaction(s): Unknown   Gabapentin Rash        Medication List        Accurate as of November 14, 2023 11:15 AM. If you have any questions, ask your nurse or doctor.          acetaminophen  325 MG tablet Commonly known as: TYLENOL  Take 650 mg by mouth every 6 (six) hours as needed.   albuterol  108 (90 Base) MCG/ACT inhaler Commonly known as: VENTOLIN  HFA Inhale 2 puffs into the lungs every 6 (six) hours as needed for wheezing or shortness of breath.   aspirin 81 MG tablet Take 81 mg by mouth daily.   Calcium/Vitamin D  600-400 MG-UNIT Tabs Take 1 tablet by mouth daily.   fexofenadine  180 MG tablet Commonly known as: ALLEGRA  Take 1 tablet (180 mg total) by mouth daily.   furosemide  40 MG tablet Commonly known as: Lasix  Take 1 tablet (40 mg total) by mouth daily.   glucose blood test strip 1 each by Other route as needed. Use as instructed   lisinopril -hydrochlorothiazide  20-25 MG tablet Commonly known as: ZESTORETIC  Take 1 tablet by mouth daily.   metoprolol  tartrate 25 MG tablet Commonly known as: LOPRESSOR  TAKE 1 TABLET BY MOUTH TWICE  DAILY    Mounjaro  7.5 MG/0.5ML Pen Generic drug: tirzepatide  INJECT 7.5MG  SUBCUTANEOUSLY  ONCE A WEEK   Mounjaro  10 MG/0.5ML Pen Generic drug: tirzepatide  Inject 10 mg into the skin once a week.   multivitamin with minerals tablet Take 1 tablet by mouth daily.   nystatin  ointment Commonly known as: MYCOSTATIN  Apply 1 Application topically 2 (two) times daily.   PARoxetine  20 MG tablet Commonly known as: PAXIL  Take 1 tablet (20 mg total) by mouth daily.   Trelegy Ellipta 100-62.5-25 MCG/INH Aepb Generic drug: Fluticasone-Umeclidin-Vilant Inhale 1 puff into the lungs daily.   Tresiba  FlexTouch 100 UNIT/ML FlexTouch Pen Generic drug: insulin degludec  Inject 65 Units into the skin at bedtime. What changed: See the new instructions. Changed by: ALAN CHRISTELLA ARRANT   Trijardy  XR 25-09-998 MG Tb24 Generic drug: Empagliflozin-Linaglip-Metform Take 1 tablet by mouth daily.   zinc gluconate 50 MG tablet Take 50 mg by mouth daily.            Assessment & Plan:   Assessment & Plan Type 2 diabetes mellitus with diabetic polyneuropathy, with long-term current  use of insulin (HCC)  Candidiasis of breast    No follow-ups on file.  Total time spent: {AMA time spent:29001} minutes  ALAN CHRISTELLA ARRANT, FNP  11/14/2023   This document may have been prepared by [facility] Voice Recognition software and as such may include unintentional dictation errors.

## 2023-11-14 NOTE — Progress Notes (Signed)
   11/14/2023  Patient ID: April Harrison, female   DOB: Sep 25, 1955, 68 y.o.   MRN: [id number]  Patient presented in office for visit with PCP and then follow up with me to discuss pharmacy needs.  Patient would like to switch to [facility] for their delivery services and ability to get 90ds from them on routine medications.  Resending prescriptions to them at patient request, with PCP approval.  Metoprolol  tartrate being converted to metoprolol  succinate once daily as patient reports she was taking tartrate tablets at same time in morning.   Patient stopped paroxetine  as she did not feel it helped and resumed sertraline , reordering sertraline  with PCP approval.  Patient never received CGM sensors from medical supply and would like to try to get at [facility].  Follow Up: 2 weeks  Jon VEAR Lindau, PharmD Clinical Pharmacist [phone]

## 2023-11-15 ENCOUNTER — Telehealth: Payer: Self-pay

## 2023-11-15 ENCOUNTER — Ambulatory Visit: Payer: Self-pay | Admitting: Physician Assistant

## 2023-11-15 NOTE — Progress Notes (Signed)
 Complex Care Management Note Care Guide Note  11/15/2023 Name: April Harrison MRN: [id number] DOB: May 08, 1956   Complex Care Management Outreach Attempts: An unsuccessful telephone outreach was attempted today to offer the patient information about available complex care management services.  Follow Up Plan:  Additional outreach attempts will be made to offer the patient complex care management information and services.   Encounter Outcome:  No Answer  Hulon Ferron [facility]  [facility] Guide Direct Dial: [phone]  Fax: [phone] Website: [email or web address]

## 2023-11-17 ENCOUNTER — Telehealth: Payer: Self-pay

## 2023-11-17 NOTE — Progress Notes (Signed)
 Complex Care Management Note Care Guide Note  11/17/2023 Name: April Harrison MRN: [id number] DOB: Dec 09, 1955   Complex Care Management Outreach Attempts: A second unsuccessful outreach was attempted today to offer the patient with information about available complex care management services.  Follow Up Plan:  Additional outreach attempts will be made to offer the patient complex care management information and services.   Encounter Outcome:  No Answer  [name] [facility]  [facility] Guide Direct Dial: [phone]  Fax: [phone] Website: [email or web address]

## 2023-11-20 ENCOUNTER — Telehealth: Payer: Self-pay

## 2023-11-20 NOTE — Progress Notes (Signed)
 Complex Care Management Note Care Guide Note  11/20/2023 Name: April Harrison MRN: [id number] DOB: 05-08-1956   Complex Care Management Outreach Attempts: An unsuccessful telephone outreach was attempted today to offer the patient information about available complex care management services.  Follow Up Plan:  Additional outreach attempts will be made to offer the patient complex care management information and services.   Encounter Outcome:  No Answer  Leotis Rase [facility], [facility] Guide  Direct Dial: [phone]  Fax [phone]

## 2023-11-20 NOTE — Progress Notes (Signed)
 Complex Care Management Note  Care Guide Note 11/20/2023 Name: April Harrison MRN: [id number] DOB: [birth year]  April Harrison is a 68 y.o. year old female who sees Orlean Alan HERO, FNP for primary care. I reached out to [employer] by phone today to offer complex care management services.  [name] was given information about Complex Care Management services today including:   The Complex Care Management services include support from the care team which includes your Nurse Care Manager, Clinical Social Worker, or Pharmacist.  The Complex Care Management team is here to help remove barriers to the health concerns and goals most important to you. Complex Care Management services are voluntary, and the patient may decline or stop services at any time by request to their care team member.   Complex Care Management Consent Status: Patient agreed to services and verbal consent obtained.   Follow up plan:  Telephone appointment with complex care management team member scheduled for:  12/04/23 @ 3 pm.   Encounter Outcome:  Patient Scheduled  Leotis Rase [facility], [facility] Guide  Direct Dial: [phone]  Fax [phone]

## 2023-11-20 NOTE — Progress Notes (Signed)
 Complex Care Management Note Care Guide Note  11/20/2023 Name: April Harrison MRN: [id number] DOB: 1955-12-17   Complex Care Management Outreach Attempts: A third unsuccessful outreach was attempted today to offer the patient with information about available complex care management services.  Follow Up Plan:  No further outreach attempts will be made at this time. We have been unable to contact the patient to offer or enroll patient in complex care management services.  Encounter Outcome:  No Answer  Leala Bryand [facility]  [facility] Guide Direct Dial: [phone]  Fax: [phone] Website: [email or web address]

## 2023-11-22 ENCOUNTER — Telehealth: Payer: Self-pay

## 2023-11-22 NOTE — Progress Notes (Signed)
 Complex Care Management Note Care Guide Note  11/22/2023 Name: April Harrison MRN: [id number] DOB: 12-Apr-1956   Complex Care Management Outreach Attempts: A third unsuccessful outreach was attempted today to offer the patient with information about available complex care management services.  Follow Up Plan:  No further outreach attempts will be made at this time. We have been unable to contact the patient to offer or enroll patient in complex care management services.  Encounter Outcome:  No Answer  Ticara Waner [facility]  [facility] Guide Direct Dial: [phone]  Fax: [phone] Website: [email or web address]

## 2023-11-24 NOTE — Progress Notes (Signed)
 Referring Physician:  Orlean Alan HERO, FNP [address] [place],  [place] [address]  Primary Physician:  Orlean Alan HERO, FNP     History of Present Illness: 11/27/23 Patient is here to discuss her imaging that has come back after evaluation with Lyle Butters.  She has a history of back pain bilateral lower extremity pain also history of rheumatoid arthritis.  She has been feeling more unsteady on her feet and has been feeling off balance.  She has had multiple falls.  She denies any radiation of her symptoms but does have some trouble with her arms and legs.   Bowel/Bladder Dysfunction: none   Conservative measures:  Physical therapy:  has not participated in PT recently Multimodal medical therapy including regular antiinflammatories:  none Injections:  no epidural steroid injections   Past Surgery: no spinal surgeries   The symptoms are causing a significant impact on the patient's life.   I have utilized the care everywhere function in epic to review the outside records available from external health systems.  Review of Systems:  A 10 point review of systems is negative, except for the pertinent positives and negatives detailed in the HPI.  Past Medical History: Past Medical History:  Diagnosis Date   Anxiety    Arthritis    Depression    Diabetes mellitus    Type 2   Family history of adverse reaction to anesthesia    sister almost died   History of chicken pox    Hypertension    Measles    Mumps    Osteoarthritis of carpometacarpal (CMC) joint of thumb 01/06/2022   Pain in right hand 01/06/2022   Pain of left hand 01/06/2022   Pancreatitis    Sleep apnea    told that she doesn't have it now   Stiffness of finger joint of left hand 02/03/2022    Past Surgical History: Past Surgical History:  Procedure Laterality Date   CESAREAN SECTION     COLONOSCOPY WITH PROPOFOL  N/A 07/21/2017   Procedure: COLONOSCOPY WITH PROPOFOL ;  Surgeon: Unk Corinn Skiff, MD;  Location: [facility] ENDOSCOPY;  Service: Gastroenterology;  Laterality: N/A;   HARDWARE REMOVAL Left    knee   HYSTEROSCOPY WITH D & C N/A 08/29/2019   Procedure: DILATATION AND CURETTAGE /HYSTEROSCOPY & MYOSURE POYLPECTOMY AND MYOMECTOMY;  Surgeon: Lake Read, MD;  Location: [facility] ORS;  Service: Gynecology;  Laterality: N/A;   KNEE SURGERY Left     Allergies: Allergies as of 11/27/2023 - Review Complete 11/27/2023  Allergen Reaction Noted   Penicillin g  02/17/2019   Gabapentin Rash 08/12/2017    Medications:  Current Outpatient Medications:    acetaminophen  (TYLENOL ) 325 MG tablet, Take 650 mg by mouth every 6 (six) hours as needed., Disp: , Rfl:    albuterol  (VENTOLIN  HFA) 108 (90 Base) MCG/ACT inhaler, Inhale 2 puffs into the lungs every 6 (six) hours as needed for wheezing or shortness of breath., Disp: 8 g, Rfl: 2   allopurinol  (ZYLOPRIM ) 100 MG tablet, Take 1 tablet (100 mg total) by mouth daily., Disp: 90 tablet, Rfl: 0   aspirin 81 MG tablet, Take 81 mg by mouth daily.  , Disp: , Rfl:    Calcium Carb-Cholecalciferol (CALCIUM/VITAMIN D ) 600-400 MG-UNIT TABS, Take 1 tablet by mouth daily., Disp: , Rfl:    Continuous Glucose Sensor (FREESTYLE LIBRE 3 PLUS SENSOR) MISC, Use to check blood sugar continuously. Change sensor every 15 days. Dx: E11.65, Disp: 6 each, Rfl: 3  Empagliflozin-Linaglip-Metform (TRIJARDY  XR) 25-09-998 MG TB24, Take 1 tablet by mouth daily., Disp: 90 tablet, Rfl: 1   fexofenadine  (ALLEGRA ) 180 MG tablet, Take 1 tablet (180 mg total) by mouth daily., Disp: 90 tablet, Rfl: 1   Fluticasone-Umeclidin-Vilant (TRELEGY ELLIPTA) 100-62.5-25 MCG/INH AEPB, Inhale 1 puff into the lungs daily., Disp: , Rfl:    furosemide  (LASIX ) 40 MG tablet, Take 1 tablet (40 mg total) by mouth daily., Disp: 90 tablet, Rfl: 1   glucose blood test strip, 1 each by Other route as needed. Use as instructed , Disp: , Rfl:    insulin degludec  (TRESIBA  FLEXTOUCH) 100 UNIT/ML  FlexTouch Pen, Inject 65 Units into the skin at bedtime., Disp: 60 mL, Rfl: 3   lisinopril -hydrochlorothiazide  (ZESTORETIC ) 20-25 MG tablet, Take 1 tablet by mouth daily., Disp: 90 tablet, Rfl: 1   metoprolol  succinate (TOPROL -XL) 50 MG 24 hr tablet, Take 1 tablet (50 mg total) by mouth daily. Take with or immediately following a meal., Disp: 90 tablet, Rfl: 1   Multiple Vitamins-Minerals (MULTIVITAMIN WITH MINERALS) tablet, Take 1 tablet by mouth daily., Disp: , Rfl:    nystatin  (MYCOSTATIN /NYSTOP ) powder, Apply 1 Application topically 2 (two) times daily., Disp: 30 g, Rfl: 0   sertraline  (ZOLOFT ) 100 MG tablet, Take 1.5 tablets (150 mg total) by mouth daily., Disp: 135 tablet, Rfl: 0   tirzepatide  (MOUNJARO ) 12.5 MG/0.5ML Pen, Inject 12.5 mg into the skin once a week., Disp: 2 mL, Rfl: 0   zinc gluconate 50 MG tablet, Take 50 mg by mouth daily., Disp: , Rfl:   Social History: Social History   Tobacco Use   Smoking status: Never   Smokeless tobacco: Never  Vaping Use   Vaping status: Never Used  Substance Use Topics   Alcohol use: No   Drug use: No    Family Medical History: Family History  Problem Relation Age of Onset   Breast cancer Neg Hx     Physical Examination: Vitals:   11/27/23 1312  BP: 124/74    General: Patient is in no apparent distress. Attention to examination is appropriate.  Neck:   Supple.  Full range of motion.  Respiratory: Patient is breathing without any difficulty.   NEUROLOGICAL:     Awake, alert, oriented to person, place, and time.  Speech is clear and fluent.   Cranial Nerves: Pupils equal round and reactive to light.  Facial tone is symmetric.  Facial sensation is symmetric. Shoulder shrug is symmetric. Tongue protrusion is midline.    Strength:Strength: Side Biceps Triceps Deltoid Interossei Grip Wrist Ext. Wrist Flex.  R 5 5 4- 4 4+ 4+ 5  L 5 5 5 4  4+ 4+ 5    Side Iliopsoas Quads Hamstring PF DF EHL  R 4- 5 5 5 4 4   L 5 5 5 5 5 5      Reflexes are 3+ on the right the biceps, triceps, brachioradialis, patella and achilles.  She has positive Hoffman's reflex as well as crossed adductor reflexes on the right.  2 beats of clonus on the right Patient has some difficulty with tandem gait.  Imaging: Narrative & Impression  CLINICAL DATA:  Chronic neck pain   EXAM: MRI CERVICAL SPINE WITHOUT CONTRAST   TECHNIQUE: Multiplanar, multisequence MR imaging of the cervical spine was performed. No intravenous contrast was administered.   COMPARISON:  CT 07/27/2023   FINDINGS: Alignment: Straightening of the cervical lordosis.  No listhesis.   Vertebrae: No fracture, evidence of discitis, or bone lesion.  Cord: Normal signal and morphology.   Posterior Fossa, vertebral arteries, paraspinal tissues: Negative.   Disc levels:   C2-C3: Bilateral facet hypertrophy without significant foraminal or canal stenosis.   C3-C4: Disc bulge with facet and uncovertebral arthropathy. Moderate canal stenosis with moderate-severe bilateral foraminal stenosis.   C4-C5: Disc osteophyte complex with right greater than left facet and uncovertebral arthropathy. Moderate-severe canal stenosis with severe right and moderate-severe left foraminal stenosis.   C5-C6: Disc osteophyte complex with bilateral facet and uncovertebral arthropathy. Severe canal and bilateral foraminal stenosis.   C6-C7: Mild disc bulge with bilateral facet and uncovertebral arthropathy. Mild canal stenosis with severe bilateral foraminal stenosis.   C7-T1: Disc bulge with bilateral facet and uncovertebral arthropathy. Mild canal stenosis with severe bilateral foraminal stenosis.   IMPRESSION: 1. Advanced multilevel cervical spondylosis with severe canal stenosis at C5-6 and moderate-severe canal stenosis at C4-5. 2. Severe bilateral foraminal stenosis at C5-6, C6-7, and C7-T1. 3. Moderate-severe bilateral foraminal stenosis at C3-4 and C4-5.      Electronically Signed   By: Mabel Converse D.O.   On: 11/15/2023 09:04   Narrative & Impression  CLINICAL DATA:  Low back pain radiating into the left leg for over 6 weeks. The patient suffered a fall few months ago.   EXAM: MRI LUMBAR SPINE WITHOUT CONTRAST   TECHNIQUE: Multiplanar, multisequence MR imaging of the lumbar spine was performed. No intravenous contrast was administered.   COMPARISON:  Plain films lumbar spine 09/18/2023.   FINDINGS: Segmentation: The patient has transitional lumbosacral anatomy with elongated transverse processes at L5. On this study, the lowest scratch the last   Alignment:  Trace anterolisthesis L3 on L4.   Vertebrae: No fracture, evidence of discitis, or bone lesion. Congenitally narrow central canal is most notable in the upper lumbar segments.   Conus medullaris and cauda equina: Conus extends to the T12-L1 level. Conus and cauda equina appear normal.   Paraspinal and other soft tissues: Right renal cyst noted.   Disc levels:   T10-11 and T11-12 are imaged in the sagittal plane only. Shallow disc bulging is seen at both levels but the central canal and foramina appear open.   T12-L1: Mild-to-moderate facet arthropathy and a right paracentral disc protrusion. There is mild to moderate narrowing in the right lateral recess. The foramina appear open.   L1-2: Broad-based disc bulge, ligamentum flavum thickening and mild-to-moderate facet arthropathy. There is moderately severe central canal stenosis and right worse than left lateral recess narrowing. Moderate bilateral foraminal narrowing also seen.   L2-3: Moderate to advanced facet arthropathy is worse on the right. There is a broad-based disc bulge and bulky ligamentum flavum thickening. Severe central canal stenosis is present. The foramina are open.   L3-4: Moderate to moderately severe facet arthropathy, disc bulge and bulky ligamentum flavum thickening cause severe  central canal and bilateral lateral recess narrowing. Mild to moderate bilateral foraminal narrowing also seen.   L4-5: There is a broad-based disc bulge with moderate facet arthropathy and some ligamentum flavum thickening. Dorsal epidural fat is prominent. There is moderate to moderately severe compression of the thecal sac by disc and epidural fat. The foramina are mildly narrowed.   L5-S1: Shallow disc bulge without stenosis.   IMPRESSION: 1. 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 There is severe central canal stenosis at L2-3 and L3-4 and moderately severe central canal stenosis at L1-2 and L4-5. 2. Mild to moderate narrowing in the right lateral recess at T12-L1. 3. The  last fully open disc space is labeled L5-S1.     Electronically Signed   By: Debby Prader M.D.   On: 11/15/2023 09:19   I have personally reviewed the images and agree with the above interpretation.  Medical Decision Making/Assessment and Plan: Ms. Stumpp is a pleasant 68 y.o. female with cervical myelopathy as well as lumbar spondylosis.  Her main complaint is of low back pain and bilateral lumbar radiculopathy, however she does have worsening unsteadiness, loss of function in her right upper extremity if she feels like her drags her right lower extremity.  She has significant weakness in right hip flexion dorsi flexion, handgrip and shoulder strength on the right.  She also has signs of pathologic reflexes including a right sided Hoffmann's as well as 2 beats of clonus in the right lower extremity.  She has spreading of her reflexes with crossed adductors.  Her MRI of her cervical spine demonstrates significant central stenosis labeled as severe at the C5-6 level and moderately severe at the C4-5 level.  There does appear to be some T2 signal change especially when looking at the cross-sectional imaging in the axial views.  She does have some adjacent level stenosis that is  worse in the foramen, however majority of her symptoms appear to be myelopathic in nature.  We did discuss with her that she will likely need to undergo a C4-C6 anterior cervical discectomy and fusion.  Her Hounsfield units are elevated in the cervical spine at the bony level, we will discuss whether or not she needs a formal DEXA scan.  Once we have this information we can move forward with scheduling.  She would like to be called to discuss this when she can discuss further with her family members.  We discussed risk benefits of surgery including but not limited to CSF leak, nerve injury, need for further surgery, failure of fusion, C5 palsy, swallowing issues, vascular injuries.  Thank you for involving me in the care of this patient.    Penne MICAEL Sharps MD/MSCR Neurosurgery

## 2023-11-26 ENCOUNTER — Encounter: Payer: Self-pay | Admitting: Family

## 2023-11-26 NOTE — Assessment & Plan Note (Signed)
 Continue current meds.  Will adjust as needed based on results.  The patient is asked to make an attempt to improve diet and exercise patterns to aid in medical management of this problem. Addressed importance of increasing and maintaining water intake.

## 2023-11-26 NOTE — Assessment & Plan Note (Signed)
 Patient stable.  Well controlled with current therapy.   Continue current meds.

## 2023-11-26 NOTE — Assessment & Plan Note (Signed)
 Continue current diabetes POC, as patient has been well controlled on current regimen.  Will adjust meds if needed based on labs.

## 2023-11-27 ENCOUNTER — Encounter: Payer: Self-pay | Admitting: Neurosurgery

## 2023-11-27 ENCOUNTER — Ambulatory Visit (INDEPENDENT_AMBULATORY_CARE_PROVIDER_SITE_OTHER): Admitting: Neurosurgery

## 2023-11-27 VITALS — BP 124/74 | Ht 59.0 in | Wt 255.0 lb

## 2023-11-27 DIAGNOSIS — M5416 Radiculopathy, lumbar region: Secondary | ICD-10-CM | POA: Diagnosis not present

## 2023-11-27 DIAGNOSIS — M4120 Other idiopathic scoliosis, site unspecified: Secondary | ICD-10-CM

## 2023-11-27 DIAGNOSIS — G8929 Other chronic pain: Secondary | ICD-10-CM | POA: Insufficient documentation

## 2023-11-27 DIAGNOSIS — M4712 Other spondylosis with myelopathy, cervical region: Secondary | ICD-10-CM | POA: Diagnosis not present

## 2023-11-27 DIAGNOSIS — M4802 Spinal stenosis, cervical region: Secondary | ICD-10-CM

## 2023-11-27 DIAGNOSIS — M542 Cervicalgia: Secondary | ICD-10-CM | POA: Insufficient documentation

## 2023-11-27 NOTE — Patient Instructions (Signed)
 Thank you for coming to see us  at the neurosurgery clinic at [facility] in [place].  We are seeing you for follow-up on your MRI of your neck and low back.  You have significant arthritis in both locations, and I understand that your low back is causing you discomfort as well.  However your cervical spine or neck has a significant amount of compression of the spinal cord at this level.  This is likely why you are having so much difficulty with walking and using your arm and leg.  We we will need to discuss your bone density, sometimes this can be seen off of your CAT scan.  We will evaluate this.   Given your symptoms we feel that you need to have the discs replaced at C4-5 and C5-6 to decompress your spinal cord.  Will discuss this with you further over the phone.

## 2023-11-28 ENCOUNTER — Other Ambulatory Visit: Payer: Self-pay

## 2023-11-28 ENCOUNTER — Ambulatory Visit: Admitting: Family

## 2023-11-28 ENCOUNTER — Encounter: Payer: Self-pay | Admitting: Family

## 2023-11-28 ENCOUNTER — Telehealth: Payer: Self-pay

## 2023-11-28 ENCOUNTER — Encounter: Payer: Self-pay | Admitting: Neurosurgery

## 2023-11-28 ENCOUNTER — Ambulatory Visit: Admitting: Physician Assistant

## 2023-11-28 VITALS — BP 132/82 | HR 65 | Ht 59.0 in | Wt 255.8 lb

## 2023-11-28 DIAGNOSIS — E1142 Type 2 diabetes mellitus with diabetic polyneuropathy: Secondary | ICD-10-CM

## 2023-11-28 DIAGNOSIS — G4733 Obstructive sleep apnea (adult) (pediatric): Secondary | ICD-10-CM | POA: Diagnosis not present

## 2023-11-28 DIAGNOSIS — M4712 Other spondylosis with myelopathy, cervical region: Secondary | ICD-10-CM

## 2023-11-28 DIAGNOSIS — E1159 Type 2 diabetes mellitus with other circulatory complications: Secondary | ICD-10-CM

## 2023-11-28 DIAGNOSIS — E782 Mixed hyperlipidemia: Secondary | ICD-10-CM

## 2023-11-28 DIAGNOSIS — E538 Deficiency of other specified B group vitamins: Secondary | ICD-10-CM

## 2023-11-28 DIAGNOSIS — Z13820 Encounter for screening for osteoporosis: Secondary | ICD-10-CM

## 2023-11-28 DIAGNOSIS — I152 Hypertension secondary to endocrine disorders: Secondary | ICD-10-CM | POA: Diagnosis not present

## 2023-11-28 DIAGNOSIS — R5383 Other fatigue: Secondary | ICD-10-CM

## 2023-11-28 DIAGNOSIS — E559 Vitamin D deficiency, unspecified: Secondary | ICD-10-CM | POA: Diagnosis not present

## 2023-11-28 DIAGNOSIS — M542 Cervicalgia: Secondary | ICD-10-CM

## 2023-11-28 MED ORDER — SERTRALINE HCL 100 MG PO TABS: 200.0000 mg | ORAL_TABLET | Freq: Every day | ORAL | 2 refills | Status: DC

## 2023-11-28 MED ORDER — NYSTATIN 100000 UNIT/GM EX POWD: 1.0000 | Freq: Two times a day (BID) | CUTANEOUS | 0 refills | Status: DC

## 2023-11-28 MED ORDER — BUSPIRONE HCL 5 MG PO TABS: 5.0000 mg | ORAL_TABLET | Freq: Two times a day (BID) | ORAL | 2 refills | Status: DC

## 2023-11-28 MED ORDER — BD PEN NEEDLE NANO U/F 32G X 4 MM MISC: 1.0000 | Freq: Every day | 3 refills | Status: AC

## 2023-11-28 NOTE — Progress Notes (Unsigned)
 ENT  Referral needs to be resent  May be switching insurance companies.  [employer]? [employer]?  Rash

## 2023-11-28 NOTE — Telephone Encounter (Signed)
 Received surgery posting sheet from Dr Claudene. Per Dr Claudene, pt informed him at her visit yesterday that she would like to call back after talking to her son. Posting sheet placed in bottom drawer.

## 2023-11-28 NOTE — Progress Notes (Signed)
   11/28/2023  Patient ID: April Harrison, female   DOB: 01/20/1956, 68 y.o.   MRN: [id number]  Met with patient in office following her PCP visit in office today.  Med Mgmt/Transport Concerns: -Patient reports she has not received any medication from [facility] yet. -Contacted [facility] while patient was in office today. Got her approved for delivery for window of 12/04/23 through 12/07/23 for all of her medications.  -Patient prefers to get sertraline  at [facility] today as she is out. Also sending in an order for pen needles.  -Provided phone numbers to transport services through [facility] and [facility].  Anxiety:  -Patient requesting to increase sertraline  to 200mg  and addition of buspirone  which worked well for in the past. PCP approves, sending in order.  Diabetes: Still not checking BG, sensor delivery pending from [facility]. Can consider decreasing insulin in future as mounjaro  gets increased.   Jon VEAR Lindau, PharmD Clinical Pharmacist [phone]

## 2023-11-29 LAB — CMP14+EGFR
ALT: 25 IU/L (ref 0–32)
AST: 18 IU/L (ref 0–40)
Albumin: 4.4 g/dL (ref 3.9–4.9)
Alkaline Phosphatase: 60 IU/L (ref 44–121)
BUN/Creatinine Ratio: 12 (ref 12–28)
BUN: 11 mg/dL (ref 8–27)
Bilirubin Total: 0.2 mg/dL (ref 0.0–1.2)
CO2: 21 mmol/L (ref 20–29)
Calcium: 9 mg/dL (ref 8.7–10.3)
Chloride: 106 mmol/L (ref 96–106)
Creatinine, Ser: 0.94 mg/dL (ref 0.57–1.00)
Globulin, Total: 2.8 g/dL (ref 1.5–4.5)
Glucose: 108 mg/dL — ABNORMAL HIGH (ref 70–99)
Potassium: 3.7 mmol/L (ref 3.5–5.2)
Sodium: 144 mmol/L (ref 134–144)
Total Protein: 7.2 g/dL (ref 6.0–8.5)
eGFR: 67 mL/min/1.73 (ref >59)

## 2023-11-29 LAB — CBC WITH DIFFERENTIAL/PLATELET
Basophils Absolute: 0.1 x10E3/uL (ref 0.0–0.2)
Basos: 1 %
EOS (ABSOLUTE): 0.2 x10E3/uL (ref 0.0–0.4)
Eos: 3 %
Hematocrit: 43.1 % (ref 34.0–46.6)
Hemoglobin: 13.5 g/dL (ref 11.1–15.9)
Immature Grans (Abs): 0 x10E3/uL (ref 0.0–0.1)
Immature Granulocytes: 0 %
Lymphocytes Absolute: 1.3 x10E3/uL (ref 0.7–3.1)
Lymphs: 17 %
MCH: 25.5 pg — ABNORMAL LOW (ref 26.6–33.0)
MCHC: 31.3 g/dL — ABNORMAL LOW (ref 31.5–35.7)
MCV: 82 fL (ref 79–97)
Monocytes Absolute: 0.6 x10E3/uL (ref 0.1–0.9)
Monocytes: 8 %
Neutrophils Absolute: 5.6 x10E3/uL (ref 1.4–7.0)
Neutrophils: 71 %
Platelets: 169 x10E3/uL (ref 150–450)
RBC: 5.29 x10E6/uL — ABNORMAL HIGH (ref 3.77–5.28)
RDW: 14.2 % (ref 11.7–15.4)
WBC: 7.8 x10E3/uL (ref 3.4–10.8)

## 2023-11-29 LAB — LIPID PANEL
Chol/HDL Ratio: 4 ratio (ref 0.0–4.4)
Cholesterol, Total: 232 mg/dL — ABNORMAL HIGH (ref 100–199)
HDL: 58 mg/dL (ref >39)
LDL Chol Calc (NIH): 114 mg/dL — ABNORMAL HIGH (ref 0–99)
Triglycerides: 347 mg/dL — ABNORMAL HIGH (ref 0–149)
VLDL Cholesterol Cal: 60 mg/dL — ABNORMAL HIGH (ref 5–40)

## 2023-11-29 LAB — IRON,TIBC AND FERRITIN PANEL
Ferritin: 222 ng/mL — ABNORMAL HIGH (ref 15–150)
Iron Saturation: 26 % (ref 15–55)
Iron: 86 ug/dL (ref 27–139)
Total Iron Binding Capacity: 333 ug/dL (ref 250–450)
UIBC: 247 ug/dL (ref 118–369)

## 2023-11-29 LAB — TSH: TSH: 3.47 u[IU]/mL (ref 0.450–4.500)

## 2023-11-29 LAB — VITAMIN B12: Vitamin B-12: 270 pg/mL (ref 232–1245)

## 2023-11-29 LAB — HEMOGLOBIN A1C
Est. average glucose Bld gHb Est-mCnc: 134 mg/dL
Hgb A1c MFr Bld: 6.3 % — ABNORMAL HIGH (ref 4.8–5.6)

## 2023-11-29 LAB — VITAMIN D 25 HYDROXY (VIT D DEFICIENCY, FRACTURES): Vit D, 25-Hydroxy: 10.4 ng/mL — ABNORMAL LOW (ref 30.0–100.0)

## 2023-12-04 ENCOUNTER — Telehealth: Payer: Self-pay | Admitting: *Deleted

## 2023-12-04 ENCOUNTER — Encounter: Payer: Self-pay | Admitting: *Deleted

## 2023-12-07 ENCOUNTER — Telehealth: Payer: Self-pay

## 2023-12-07 ENCOUNTER — Ambulatory Visit: Payer: Self-pay

## 2023-12-07 ENCOUNTER — Other Ambulatory Visit: Payer: Self-pay

## 2023-12-07 DIAGNOSIS — E559 Vitamin D deficiency, unspecified: Secondary | ICD-10-CM

## 2023-12-07 MED ORDER — VITAMIN D (ERGOCALCIFEROL) 1.25 MG (50000 UNIT) PO CAPS: 50000.0000 [IU] | ORAL_CAPSULE | ORAL | 3 refills | Status: DC

## 2023-12-07 NOTE — Progress Notes (Unsigned)
 Complex Care Management Care Guide Note  12/07/2023 Name: CHELSAE ZANELLA MRN: [id number] DOB: 1955/06/07  Makita A Milholland is a 68 y.o. year old female who is a primary care patient of Orlean Alan HERO, FNP and is actively engaged with the care management team. I reached out to Mitsue A Gougeon by phone today to assist with re-scheduling  with the RN Case Manager.  Follow up plan: Unsuccessful telephone outreach attempt made. A HIPAA compliant phone message was left for the patient providing contact information and requesting a return call.  Leotis Rase [facility], [facility] Guide  Direct Dial: [phone]  Fax [phone]

## 2023-12-08 NOTE — Progress Notes (Signed)
 Complex Care Management Note Care Guide Note  12/08/2023 Name: April Harrison MRN: [id number] DOB: [birth year]   Complex Care Management Outreach Attempts: A second unsuccessful outreach was attempted today to offer the patient with information about available complex care management services.  Follow Up Plan:  No further outreach attempts will be made at this time. We have been unable to contact the patient to offer or enroll patient in complex care management services.  Encounter Outcome:  No Answer  Leotis Rase [facility], [facility] Guide  Direct Dial: [phone]  Fax [phone]

## 2023-12-12 ENCOUNTER — Other Ambulatory Visit

## 2023-12-12 DIAGNOSIS — Z794 Long term (current) use of insulin: Secondary | ICD-10-CM

## 2023-12-12 NOTE — Progress Notes (Signed)
   12/12/2023  Patient ID: Alvin DELENA Rams, female   DOB: January 23, 1956, 68 y.o.   MRN: [id number]  Met with patient via telephone to follow up on prescription chang  Med Mgmt/Transport Concerns: -Patient has received her medications from [facility]. -Provided phone numbers to transport services through [facility] and [facility].  Anxiety:  -Patient has not started the dose increased sertraline  yet, reminded her she can take 2 tablets daily. Patient will start tmrw.  Diabetes: Confirmed she has sensors but has not placed them yet, wants assistance. Scheduled in office visit for next week.  Jon VEAR Lindau, PharmD Clinical Pharmacist [phone]

## 2023-12-15 ENCOUNTER — Telehealth: Payer: Self-pay

## 2023-12-15 NOTE — Progress Notes (Signed)
 Complex Care Management Care Guide Note  12/15/2023 Name: April Harrison MRN: [id number] DOB: 07-23-1955  April Harrison is a 68 y.o. year old female who is a primary care patient of Orlean Alan HERO, FNP and is actively engaged with the care management team. I reached out to Khalidah A Tomas by phone today to assist with re-scheduling  with the RN Case Manager.  Follow up plan: Unsuccessful telephone outreach attempt made. A HIPAA compliant phone message was left for the patient providing contact information and requesting a return call.  Leotis Rase [facility], [facility] Guide  Direct Dial: [phone]  Fax [phone]

## 2023-12-17 ENCOUNTER — Other Ambulatory Visit: Payer: Self-pay | Admitting: Family

## 2023-12-19 ENCOUNTER — Other Ambulatory Visit: Payer: Self-pay

## 2023-12-19 NOTE — Progress Notes (Signed)
   12/19/2023  Patient ID: April Harrison, female   DOB: [birth year], 68 y.o.   MRN: [id number]  Patient presented in office for review of how to use freestyle libre 3 plus sensor device and app setup.  Reviewed use and how to properly apply. Patient expresses understanding. Applied sensor for patient and setup app.  Connected patient to libre3 app for remote review of BG control at future appts.  Provided patient with phone number to [facility] for insurance services and with info to local senior center at her request.  Follow Up: 3 weeks  Jon VEAR Lindau, PharmD Clinical Pharmacist [phone]

## 2023-12-25 ENCOUNTER — Encounter: Payer: Self-pay | Admitting: *Deleted

## 2023-12-28 ENCOUNTER — Encounter: Payer: Self-pay | Admitting: *Deleted

## 2023-12-28 ENCOUNTER — Telehealth: Payer: Self-pay

## 2023-12-28 ENCOUNTER — Telehealth: Payer: Self-pay | Admitting: *Deleted

## 2023-12-28 NOTE — Progress Notes (Signed)
 Complex Care Management Care Guide Note  12/28/2023 Name: April Harrison MRN: [id number] DOB: 1956/01/20  April Harrison is a 68 y.o. year old female who is a primary care patient of Orlean Alan HERO, FNP and is actively engaged with the care management team. I reached out to Elizaveta A Lohmann by phone today to assist with re-scheduling  with the RN Case Manager.  Follow up plan: Unsuccessful telephone outreach attempt made. A HIPAA compliant phone message was left for the patient providing contact information and requesting a return call.  Leotis Rase [facility], [facility] Guide  Direct Dial: [phone]  Fax [phone]

## 2024-01-09 ENCOUNTER — Other Ambulatory Visit: Payer: Self-pay

## 2024-01-09 NOTE — Progress Notes (Signed)
   01/09/2024  Patient ID: April Harrison, female   DOB: [birth year], 68 y.o.   MRN: [id number]  Met with patient via telephone to follow up on diabetes management  Diabetes: Current Medications: Mounjaro  12.5mg  once weekly, Trijardy  XR 25-5-1000mg  daily, Tresiba  40 units daily  Sugar Review:   Very well-controlled. Patient expresses wish to decrease her insulin dose.  Plan: Continue trijardy  and mounjaro . DECREASE Tresiba  to 34 units daily. Consider increasing mounjaro  to 15mg  in future and further reduction in insulin dose if BG control allows  Follow Up: 1 week   Jon VEAR Lindau, PharmD Clinical Pharmacist [phone]

## 2024-01-11 ENCOUNTER — Other Ambulatory Visit: Payer: Self-pay | Admitting: *Deleted

## 2024-01-11 NOTE — Patient Outreach (Signed)
 Complex Care Management   Visit Note  02/16/2024 update for 01/11/24  Name:  April Harrison MRN: [id number] DOB: 06-10-1955  Situation: Referral received for Complex Care Management related to Diabetes with Complications I obtained verbal consent from Patient.  Visit completed with Patient  on the phone  Background:   Past Medical History:  Diagnosis Date   Anxiety    Arthritis    Depression    Diabetes mellitus    Type 2   Family history of adverse reaction to anesthesia    sister almost died   History of chicken pox    Hypertension    Measles    Mumps    Osteoarthritis of carpometacarpal (CMC) joint of thumb 01/06/2022   Pain in right hand 01/06/2022   Pain of left hand 01/06/2022   Pancreatitis    Sleep apnea    told that she doesn't have it now   Stiffness of finger joint of left hand 02/03/2022    Assessment: Patient Reported Symptoms:  Cognitive Cognitive Status: Alert and oriented to person, place, and time, Insightful and able to interpret abstract concepts, Normal speech and language skills Cognitive/Intellectual Conditions Management [RPT]: None reported or documented in medical history or problem list   Health Maintenance Behaviors: Annual physical exam, Sleep adequate Healing Pattern: Average Health Facilitated by: Rest, Pain control  Neurological Neurological Review of Symptoms: No symptoms reported Neurological Self-Management Outcome: 4 (good)  HEENT        Cardiovascular      Respiratory      Endocrine Endocrine Symptoms Reported: Increased thirst, Unintentional weight gain Is patient diabetic?: Yes Is patient checking blood sugars at home?: Yes List most recent blood sugar readings, include date and time of day: received the [facility] Endocrine Self-Management Outcome: 3 (uncertain) Endocrine Comment: last HgA1c 6.3 in 12/2023  Gastrointestinal        Genitourinary      Integumentary      Musculoskeletal          Psychosocial             02/16/2024    PHQ2-9 Depression Screening   Little interest or pleasure in doing things    Feeling down, depressed, or hopeless    PHQ-2 - Total Score    Trouble falling or staying asleep, or sleeping too much    Feeling tired or having little energy    Poor appetite or overeating     Feeling bad about yourself - or that you are a failure or have let yourself or your family down    Trouble concentrating on things, such as reading the newspaper or watching television    Moving or speaking so slowly that other people could have noticed.  Or the opposite - being so fidgety or restless that you have been moving around a lot more than usual    Thoughts that you would be better off dead, or hurting yourself in some way    PHQ2-9 Total Score    If you checked off any problems, how difficult have these problems made it for you to do your work, take care of things at home, or get along with other people    Depression Interventions/Treatment      There were no vitals filed for this visit.  Medications Reviewed Today   Medications were not reviewed in this encounter     Recommendation:   PCP Follow-up Continue Current Plan of Care  Follow Up Plan:   Telephone follow  up appointment date/time:  pending   Euphemia Lingerfelt L. Ramonita, RN, BSN, CCM [place]  [facility], [facility] RN Care Manager Direct Dial: [phone]  Fax: [phone]

## 2024-01-16 ENCOUNTER — Telehealth: Payer: Self-pay

## 2024-01-16 ENCOUNTER — Other Ambulatory Visit: Payer: Self-pay

## 2024-01-16 NOTE — Progress Notes (Signed)
   01/16/2024  Patient ID: April Harrison, female   DOB: [birth year], 68 y.o.   MRN: [id number]  Attempted to contact patient for scheduled appointment for medication management. Left HIPAA compliant message for patient to return my call at their convenience.

## 2024-01-17 ENCOUNTER — Other Ambulatory Visit: Payer: Self-pay

## 2024-01-18 ENCOUNTER — Other Ambulatory Visit: Payer: Self-pay

## 2024-01-18 DIAGNOSIS — E1142 Type 2 diabetes mellitus with diabetic polyneuropathy: Secondary | ICD-10-CM

## 2024-01-18 NOTE — Progress Notes (Signed)
   01/18/2024  Patient ID: April Harrison, female   DOB: 08/15/1955, 68 y.o.   MRN: [id number]  Met with patient via telephone to follow up on diabetes management  Diabetes: Current Medications: Mounjaro  12.5mg  once weekly, Trijardy  XR 25-5-1000mg  daily, Tresiba  40 units daily  Sugar Review:   Very well-controlled. Patient expresses wish to further decrease her insulin dose.  Plan: Continue trijardy  and mounjaro . DECREASE Tresiba  to 28 units daily. Consider increasing mounjaro  to 15mg  in future and further reduction in insulin dose if BG control allows  Follow Up: 2 WEEKS   Jon VEAR Lindau, PharmD Clinical Pharmacist [phone]

## 2024-01-21 ENCOUNTER — Other Ambulatory Visit: Payer: Self-pay | Admitting: Family

## 2024-01-25 ENCOUNTER — Telehealth: Payer: Self-pay | Admitting: *Deleted

## 2024-01-25 ENCOUNTER — Encounter: Payer: Self-pay | Admitting: *Deleted

## 2024-01-29 ENCOUNTER — Other Ambulatory Visit: Payer: Self-pay | Admitting: Family

## 2024-01-30 ENCOUNTER — Telehealth: Payer: Self-pay

## 2024-01-30 ENCOUNTER — Other Ambulatory Visit: Payer: Self-pay

## 2024-01-30 NOTE — Progress Notes (Signed)
   01/30/2024  Patient ID: April Harrison, female   DOB: 03-16-1956, 68 y.o.   MRN: [id number]  Attempted to contact patient for scheduled appointment for medication management. Left HIPAA compliant message for patient to return my call at their convenience.   Jon VEAR Lindau, PharmD Clinical Pharmacist [phone]

## 2024-02-01 ENCOUNTER — Other Ambulatory Visit

## 2024-02-01 DIAGNOSIS — E1142 Type 2 diabetes mellitus with diabetic polyneuropathy: Secondary | ICD-10-CM

## 2024-02-01 NOTE — Progress Notes (Signed)
   02/01/2024  Patient ID: April Harrison, female   DOB: February 09, 1956, 68 y.o.   MRN: [id number]  Met with patient via telephone to follow up on diabetes management  Diabetes: Current Medications: Mounjaro  12.5mg  once weekly, Trijardy  XR 25-5-1000mg  daily, Tresiba  28 units daily (pt reports she has only been using 25 units)  Sugar Review:   Very well-controlled. Patient expresses wish to further decrease her insulin dose.  Plan: Continue trijardy  and mounjaro . DECREASE Tresiba  to 20 units daily. Consider increasing mounjaro  to 15mg  in future and further reduction in insulin dose if BG control allows  Follow Up: 2 WEEKS   Jon VEAR Lindau, PharmD Clinical Pharmacist [phone]

## 2024-02-05 ENCOUNTER — Other Ambulatory Visit: Payer: Self-pay | Admitting: Family

## 2024-02-13 ENCOUNTER — Other Ambulatory Visit: Payer: Self-pay

## 2024-02-13 DIAGNOSIS — E1142 Type 2 diabetes mellitus with diabetic polyneuropathy: Secondary | ICD-10-CM

## 2024-02-13 NOTE — Progress Notes (Signed)
   02/13/2024  Patient ID: April Harrison, female   DOB: 07/25/1955, 68 y.o.   MRN: [id number]  Met with patient via telephone to follow up on diabetes management  Diabetes: Current Medications: Mounjaro  12.5mg  once weekly, Trijardy  XR 25-5-1000mg  daily, Tresiba  20 units daily  Sugar Review: Sensor fell off 02/01/24, patient has yet to place a new one so unsure how her BG has been doing on 20 units of insulin  Plan: Continue current medication therapy. Consider increasing mounjaro  to 15mg  in future and further reduction in insulin dose if BG control allows  Follow Up: 1 week   Jon VEAR Lindau, PharmD Clinical Pharmacist [phone]

## 2024-02-20 ENCOUNTER — Telehealth: Payer: Self-pay

## 2024-02-20 ENCOUNTER — Other Ambulatory Visit: Payer: Self-pay

## 2024-02-20 NOTE — Progress Notes (Signed)
   02/20/2024  Patient ID: April Harrison, female   DOB: July 20, 1955, 68 y.o.   MRN: [id number]  Attempted to contact patient for scheduled appointment for medication management. Was not given option to leave voicemail.  Jon VEAR Lindau, PharmD Clinical Pharmacist [phone]

## 2024-02-24 ENCOUNTER — Other Ambulatory Visit: Payer: Self-pay | Admitting: Family

## 2024-02-28 ENCOUNTER — Ambulatory Visit (INDEPENDENT_AMBULATORY_CARE_PROVIDER_SITE_OTHER): Admitting: Family

## 2024-02-28 ENCOUNTER — Encounter: Payer: Self-pay | Admitting: Family

## 2024-02-28 VITALS — BP 140/82 | HR 70 | Ht 59.0 in | Wt 256.4 lb

## 2024-02-28 DIAGNOSIS — R5383 Other fatigue: Secondary | ICD-10-CM | POA: Insufficient documentation

## 2024-02-28 DIAGNOSIS — Z794 Long term (current) use of insulin: Secondary | ICD-10-CM

## 2024-02-28 DIAGNOSIS — M542 Cervicalgia: Secondary | ICD-10-CM | POA: Diagnosis not present

## 2024-02-28 DIAGNOSIS — E538 Deficiency of other specified B group vitamins: Secondary | ICD-10-CM | POA: Diagnosis not present

## 2024-02-28 DIAGNOSIS — Z23 Encounter for immunization: Secondary | ICD-10-CM | POA: Insufficient documentation

## 2024-02-28 DIAGNOSIS — Z13820 Encounter for screening for osteoporosis: Secondary | ICD-10-CM | POA: Diagnosis not present

## 2024-02-28 DIAGNOSIS — F331 Major depressive disorder, recurrent, moderate: Secondary | ICD-10-CM | POA: Diagnosis not present

## 2024-02-28 DIAGNOSIS — E1169 Type 2 diabetes mellitus with other specified complication: Secondary | ICD-10-CM | POA: Diagnosis not present

## 2024-02-28 DIAGNOSIS — I152 Hypertension secondary to endocrine disorders: Secondary | ICD-10-CM

## 2024-02-28 DIAGNOSIS — Z6841 Body Mass Index (BMI) 40.0 and over, adult: Secondary | ICD-10-CM | POA: Insufficient documentation

## 2024-02-28 DIAGNOSIS — G4733 Obstructive sleep apnea (adult) (pediatric): Secondary | ICD-10-CM | POA: Diagnosis not present

## 2024-02-28 DIAGNOSIS — E66813 Obesity, class 3: Secondary | ICD-10-CM

## 2024-02-28 DIAGNOSIS — E782 Mixed hyperlipidemia: Secondary | ICD-10-CM

## 2024-02-28 DIAGNOSIS — E1142 Type 2 diabetes mellitus with diabetic polyneuropathy: Secondary | ICD-10-CM | POA: Insufficient documentation

## 2024-02-28 DIAGNOSIS — E1159 Type 2 diabetes mellitus with other circulatory complications: Secondary | ICD-10-CM

## 2024-02-28 DIAGNOSIS — E559 Vitamin D deficiency, unspecified: Secondary | ICD-10-CM

## 2024-02-28 MED ORDER — MOUNJARO 12.5 MG/0.5ML ~~LOC~~ SOAJ: 12.5000 mg | SUBCUTANEOUS | 11 refills | Status: DC

## 2024-02-28 NOTE — Assessment & Plan Note (Addendum)
 -  check labs today

## 2024-02-28 NOTE — Assessment & Plan Note (Addendum)
-   continue taking medications as prescribed. - check labs today and adjust treatment plan as necessary. - reinforced healthy diet and exercise as tolerated

## 2024-02-28 NOTE — Assessment & Plan Note (Addendum)
-   check labs today - recommend supplementation as needed based off lab results

## 2024-02-28 NOTE — Assessment & Plan Note (Addendum)
-   continue medications as prescribed. -healthy diet and exercise as tolerated. -recommended daily foot checks

## 2024-02-28 NOTE — Assessment & Plan Note (Addendum)
 -  flu shot given today

## 2024-02-28 NOTE — Assessment & Plan Note (Addendum)
-  ordered DEXA scan

## 2024-02-28 NOTE — Assessment & Plan Note (Addendum)
-   continue taking medications as prescribed. - discussed healthy coping mechanisms and stress reduction techniques

## 2024-02-28 NOTE — Assessment & Plan Note (Addendum)
-   provided patient with voltaren gel sample

## 2024-02-28 NOTE — Progress Notes (Signed)
 Established Patient Office Visit  Subjective:  Patient ID: April Harrison, female    DOB: [birth year]  Age: 68 y.o. MRN: [id number]  Chief Complaint  Patient presents with   Follow-up    3 months, wants vit D check    Patient is here today for her 3 months follow up.  She has been feeling fairly well since last appointment.   She does have additional concerns to discuss today. She reports having increased stress but she is coping with it well. Patient also reports red itching rash in her umbilicus and in her groin area. She reports she uses a powder under her breasts and bought OTC vagasil for her groin area but wants to know what she can do with the itching in her umbilicus.  Labs are due today.  She needs refills. She reports she did not have any remaining Mounjaro  injections to do this week.  I have reviewed her active problem list, medication list, allergies, family history, social history, health maintenance, notes from last encounter, lab results for her appointment today.    Patient wants flu shot today.    No other concerns at this time.   Past Medical History:  Diagnosis Date   Anxiety    Arthritis    Depression    Diabetes mellitus    Type 2   Family history of adverse reaction to anesthesia    sister almost died   History of chicken pox    Hypertension    Measles    Mumps    Osteoarthritis of carpometacarpal (CMC) joint of thumb 01/06/2022   Pain in right hand 01/06/2022   Pain of left hand 01/06/2022   Pancreatitis    Sleep apnea    told that she doesn't have it now   Stiffness of finger joint of left hand 02/03/2022    Past Surgical History:  Procedure Laterality Date   CESAREAN SECTION     COLONOSCOPY WITH PROPOFOL  N/A 07/21/2017   Procedure: COLONOSCOPY WITH PROPOFOL ;  Surgeon: Unk Corinn Skiff, MD;  Location: [facility] ENDOSCOPY;  Service: Gastroenterology;  Laterality: N/A;   HARDWARE REMOVAL Left    knee   HYSTEROSCOPY WITH D & C N/A 08/29/2019    Procedure: DILATATION AND CURETTAGE /HYSTEROSCOPY & MYOSURE POYLPECTOMY AND MYOMECTOMY;  Surgeon: Lake Read, MD;  Location: [facility] ORS;  Service: Gynecology;  Laterality: N/A;   KNEE SURGERY Left     Social History   Socioeconomic History   Marital status: Single    Spouse name: Not on file   Number of children: Not on file   Years of education: Not on file   Highest education level: Not on file  Occupational History   Not on file  Tobacco Use   Smoking status: Never   Smokeless tobacco: Never  Vaping Use   Vaping status: Never Used  Substance and Sexual Activity   Alcohol use: No   Drug use: No   Sexual activity: Not Currently    Birth control/protection: None  Other Topics Concern   Not on file  Social History Narrative   ** Merged History Encounter **       Social Drivers of Corporate investment banker Strain: Not on file  Food Insecurity: Not on file  Transportation Needs: Not on file  Physical Activity: Not on file  Stress: Not on file  Social Connections: Not on file  Intimate Partner Violence: Not on file    Family History  Problem Relation Age of  Onset   Breast cancer Neg Hx     Allergies  Allergen Reactions   Penicillin G     Other reaction(s): Unknown   Gabapentin Rash    Review of Systems  Constitutional:  Positive for malaise/fatigue.  HENT: Negative.    Eyes:  Negative for blurred vision and pain.  Respiratory:  Negative for cough and shortness of breath.   Cardiovascular:  Negative for chest pain, palpitations, claudication and leg swelling.  Gastrointestinal:  Negative for abdominal pain, blood in stool, constipation, diarrhea, nausea and vomiting.  Genitourinary:  Negative for dysuria, frequency and urgency.  Musculoskeletal: Negative.   Skin:  Positive for itching and rash.       Umbilicus, groin  Neurological:  Negative for dizziness, tingling, sensory change and headaches.  Endo/Heme/Allergies: Negative.    Psychiatric/Behavioral:  The patient has insomnia.        Objective:   BP (!) 140/82   Pulse 70   Ht 4' 11 (1.499 m)   Wt 256 lb 6.4 oz (116.3 kg)   SpO2 97%   BMI 51.79 kg/m   Vitals:   02/28/24 1313  BP: (!) 140/82  Pulse: 70  Height: 4' 11 (1.499 m)  Weight: 256 lb 6.4 oz (116.3 kg)  SpO2: 97%  BMI (Calculated): 51.76    Physical Exam Vitals and nursing note reviewed.  Constitutional:      Appearance: Normal appearance.  HENT:     Head: Normocephalic.  Eyes:     Extraocular Movements: Extraocular movements intact.     Pupils: Pupils are equal, round, and reactive to light.  Cardiovascular:     Rate and Rhythm: Normal rate and regular rhythm.     Pulses: Normal pulses.     Heart sounds: Normal heart sounds. No murmur heard. Pulmonary:     Effort: Pulmonary effort is normal. No respiratory distress.     Breath sounds: Normal breath sounds.  Abdominal:     General: There is no distension.     Tenderness: There is no abdominal tenderness.  Musculoskeletal:        General: No tenderness. Normal range of motion.     Cervical back: Normal range of motion and neck supple.     Right lower leg: No edema.     Left lower leg: No edema.  Skin:    General: Skin is warm and dry.     Coloration: Skin is not jaundiced.     Findings: No erythema.  Neurological:     General: No focal deficit present.     Mental Status: She is alert and oriented to person, place, and time.  Psychiatric:        Mood and Affect: Mood normal.        Speech: Speech normal.        Behavior: Behavior is cooperative.        Cognition and Memory: Memory is not impaired.      No results found for any visits on 02/28/24.  No results found for this or any previous visit (from the past 2160 hours).     Assessment & Plan Class 3 severe obesity due to excess calories with serious comorbidity and body mass index (BMI) of 50.0 to 59.9 in adult [facility]) Combined hyperlipidemia associated with  type 2 diabetes mellitus (HCC) Hypertension associated with diabetes (HCC) Obstructive sleep apnea syndrome Morbid obesity (HCC) Type 2 diabetes mellitus with diabetic polyneuropathy, with long-term current use of insulin (HCC) Mixed hyperlipidemia - continue taking  medications as prescribed. - check labs today and adjust treatment plan as necessary. - reinforced healthy diet and exercise as tolerated  Vitamin D  deficiency - check labs today - recommend supplementation as needed based off lab results  Major depressive disorder, recurrent episode, moderate degree (HCC) - continue taking medications as prescribed. - discussed healthy coping mechanisms and stress reduction techniques  Neck pain - provided patient with voltaren gel sample  Osteoporosis screening - ordered DEXA scan  Polyneuropathy due to type 2 diabetes mellitus (HCC) - continue medications as prescribed. -healthy diet and exercise as tolerated. -recommended daily foot checks  Other fatigue B12 deficiency due to diet - check labs today  Needs flu shot - flu shot given today    Return in about 3 months (around 05/30/2024).   Total time spent: 25 minutes  April DELENA Cain, FNP  02/28/2024   This document may have been prepared by [facility] Voice Recognition software and as such may include unintentional dictation errors.

## 2024-02-29 LAB — VITAMIN D 25 HYDROXY (VIT D DEFICIENCY, FRACTURES): Vit D, 25-Hydroxy: 62.1 ng/mL (ref 30.0–100.0)

## 2024-02-29 LAB — CBC WITH DIFFERENTIAL/PLATELET
Basophils Absolute: 0.1 x10E3/uL (ref 0.0–0.2)
Basos: 1 %
EOS (ABSOLUTE): 0.3 x10E3/uL (ref 0.0–0.4)
Eos: 4 %
Hematocrit: 39.5 % (ref 34.0–46.6)
Hemoglobin: 12.7 g/dL (ref 11.1–15.9)
Immature Grans (Abs): 0 x10E3/uL (ref 0.0–0.1)
Immature Granulocytes: 0 %
Lymphocytes Absolute: 1.3 x10E3/uL (ref 0.7–3.1)
Lymphs: 16 %
MCH: 25.7 pg — ABNORMAL LOW (ref 26.6–33.0)
MCHC: 32.2 g/dL (ref 31.5–35.7)
MCV: 80 fL (ref 79–97)
Monocytes Absolute: 0.6 x10E3/uL (ref 0.1–0.9)
Monocytes: 7 %
Neutrophils Absolute: 6 x10E3/uL (ref 1.4–7.0)
Neutrophils: 72 %
Platelets: 169 x10E3/uL (ref 150–450)
RBC: 4.94 x10E6/uL (ref 3.77–5.28)
RDW: 14.9 % (ref 11.7–15.4)
WBC: 8.3 x10E3/uL (ref 3.4–10.8)

## 2024-02-29 LAB — LIPID PANEL
Chol/HDL Ratio: 2.8 ratio (ref 0.0–4.4)
Cholesterol, Total: 109 mg/dL (ref 100–199)
HDL: 39 mg/dL — ABNORMAL LOW (ref >39)
LDL Chol Calc (NIH): 54 mg/dL (ref 0–99)
Triglycerides: 82 mg/dL (ref 0–149)
VLDL Cholesterol Cal: 16 mg/dL (ref 5–40)

## 2024-02-29 LAB — CMP14+EGFR
ALT: 9 IU/L (ref 0–32)
AST: 14 IU/L (ref 0–40)
Albumin: 4.1 g/dL (ref 3.9–4.9)
Alkaline Phosphatase: 91 IU/L (ref 49–135)
BUN/Creatinine Ratio: 13 (ref 12–28)
BUN: 15 mg/dL (ref 8–27)
Bilirubin Total: 0.3 mg/dL (ref 0.0–1.2)
CO2: 28 mmol/L (ref 20–29)
Calcium: 9.5 mg/dL (ref 8.7–10.3)
Chloride: 103 mmol/L (ref 96–106)
Creatinine, Ser: 1.15 mg/dL — ABNORMAL HIGH (ref 0.57–1.00)
Globulin, Total: 2.6 g/dL (ref 1.5–4.5)
Glucose: 125 mg/dL — ABNORMAL HIGH (ref 70–99)
Potassium: 4.5 mmol/L (ref 3.5–5.2)
Sodium: 144 mmol/L (ref 134–144)
Total Protein: 6.7 g/dL (ref 6.0–8.5)
eGFR: 52 mL/min/1.73 — ABNORMAL LOW (ref >59)

## 2024-02-29 LAB — HEMOGLOBIN A1C
Est. average glucose Bld gHb Est-mCnc: 126 mg/dL
Hgb A1c MFr Bld: 6 % — ABNORMAL HIGH (ref 4.8–5.6)

## 2024-02-29 LAB — IRON,TIBC AND FERRITIN PANEL
Ferritin: 21 ng/mL (ref 15–150)
Iron Saturation: 13 % — ABNORMAL LOW (ref 15–55)
Iron: 42 ug/dL (ref 27–139)
Total Iron Binding Capacity: 334 ug/dL (ref 250–450)
UIBC: 292 ug/dL (ref 118–369)

## 2024-02-29 LAB — VITAMIN B12: Vitamin B-12: 400 pg/mL (ref 232–1245)

## 2024-02-29 LAB — TSH: TSH: 1.47 u[IU]/mL (ref 0.450–4.500)

## 2024-03-19 ENCOUNTER — Ambulatory Visit: Admitting: Diagnostic Neuroimaging

## 2024-03-19 ENCOUNTER — Encounter: Payer: Self-pay | Admitting: Diagnostic Neuroimaging

## 2024-03-21 ENCOUNTER — Encounter: Payer: Self-pay | Admitting: *Deleted

## 2024-03-21 NOTE — Progress Notes (Signed)
 April Harrison                                          MRN: [id number]   03/21/2024   The [facility] Quality Team Specialist reviewed this patient medical record for the purposes of chart review for care gap closure. The following were reviewed: abstraction for care gap closure-breast cancer screening, glycemic status assessment, and kidney health evaluation for diabetes:eGFR  and uACR.    [facility]I Quality Team

## 2024-03-24 DIAGNOSIS — B9689 Other specified bacterial agents as the cause of diseases classified elsewhere: Secondary | ICD-10-CM | POA: Diagnosis not present

## 2024-03-24 DIAGNOSIS — J209 Acute bronchitis, unspecified: Secondary | ICD-10-CM | POA: Diagnosis not present

## 2024-03-24 DIAGNOSIS — J019 Acute sinusitis, unspecified: Secondary | ICD-10-CM | POA: Diagnosis not present

## 2024-03-30 ENCOUNTER — Other Ambulatory Visit: Payer: Self-pay | Admitting: Family

## 2024-04-06 ENCOUNTER — Emergency Department

## 2024-04-06 ENCOUNTER — Emergency Department
Admission: EM | Admit: 2024-04-06 | Discharge: 2024-04-06 | Disposition: A | Discharge status: Home / Self Care | Attending: Emergency Medicine | Admitting: Emergency Medicine

## 2024-04-06 ENCOUNTER — Other Ambulatory Visit: Payer: Self-pay

## 2024-04-06 DIAGNOSIS — G238 Other specified degenerative diseases of basal ganglia: Secondary | ICD-10-CM | POA: Diagnosis not present

## 2024-04-06 DIAGNOSIS — M47812 Spondylosis without myelopathy or radiculopathy, cervical region: Secondary | ICD-10-CM | POA: Diagnosis not present

## 2024-04-06 DIAGNOSIS — E119 Type 2 diabetes mellitus without complications: Secondary | ICD-10-CM | POA: Insufficient documentation

## 2024-04-06 DIAGNOSIS — M436 Torticollis: Secondary | ICD-10-CM | POA: Insufficient documentation

## 2024-04-06 DIAGNOSIS — M542 Cervicalgia: Secondary | ICD-10-CM | POA: Diagnosis not present

## 2024-04-06 DIAGNOSIS — M5030 Other cervical disc degeneration, unspecified cervical region: Secondary | ICD-10-CM | POA: Diagnosis not present

## 2024-04-06 DIAGNOSIS — I10 Essential (primary) hypertension: Secondary | ICD-10-CM | POA: Insufficient documentation

## 2024-04-06 DIAGNOSIS — M4802 Spinal stenosis, cervical region: Secondary | ICD-10-CM | POA: Diagnosis not present

## 2024-04-06 DIAGNOSIS — R519 Headache, unspecified: Secondary | ICD-10-CM | POA: Diagnosis not present

## 2024-04-06 MED ORDER — PREDNISONE 10 MG (21) PO TBPK: ORAL_TABLET | ORAL | 0 refills | Status: AC

## 2024-04-06 MED ORDER — LIDOCAINE 5 % EX PTCH: 1.0000 | MEDICATED_PATCH | Freq: Two times a day (BID) | CUTANEOUS | 0 refills | Status: AC

## 2024-04-06 MED ORDER — KETOROLAC TROMETHAMINE 15 MG/ML IJ SOLN
15.0000 mg | Freq: Once | INTRAMUSCULAR | Status: AC
  Administered 2024-04-06: 15 mg via INTRAVENOUS
  Filled 2024-04-06: qty 1

## 2024-04-06 MED ORDER — NAPROXEN 500 MG PO TABS: 500.0000 mg | ORAL_TABLET | Freq: Two times a day (BID) | ORAL | 0 refills | Status: AC

## 2024-04-06 MED ORDER — DEXAMETHASONE SOD PHOSPHATE PF 10 MG/ML IJ SOLN
10.0000 mg | Freq: Once | INTRAMUSCULAR | Status: AC
  Administered 2024-04-06: 10 mg via INTRAVENOUS

## 2024-04-06 NOTE — ED Provider Notes (Signed)
 [facility] Provider Note    Event Date/Time   First MD Initiated Contact with Patient 04/06/24 1455     (approximate)   History   Neck Pain and Torticollis   HPI  April Harrison is a 68 y.o. female who presents today for evaluation of neck pain for the past 3 days.  Patient reports that her pain is across the back of her neck and radiates into her left shoulder.  She denies any weakness in her arms, no paresthesias in her arms.  She reports that the pain radiates up to the base of her head.  Her pain has been gradually worsening.  There is no sudden onset to her pain.  She denies any injuries.  No dizziness, visual changes, or gait instability.  No nausea or vomiting.  Patient Active Problem List   Diagnosis Date Noted   Class 3 severe obesity due to excess calories with serious comorbidity and body mass index (BMI) of 50.0 to 59.9 in adult [facility]) 02/28/2024   Osteoporosis screening 02/28/2024   Type 2 diabetes mellitus with diabetic polyneuropathy, with long-term current use of insulin (HCC) 02/28/2024   Other fatigue 02/28/2024   B12 deficiency due to diet 02/28/2024   Needs flu shot 02/28/2024   Neck pain 11/27/2023   Lumbar radiculopathy 11/27/2023   Spondylosis, cervical, with myelopathy 11/27/2023   Memory loss, long term 11/05/2023   Moderately severe major depression (HCC) 08/29/2023   Combined hyperlipidemia associated with type 2 diabetes mellitus (HCC) 06/06/2023   Stiffness of left hand joint 01/07/2022   Osteoarthritis of carpometacarpal (CMC) joint of thumb 01/06/2022   Endometrial hyperplasia without atypia, simple 10/18/2019   Postmenopausal bleeding    Endometrial polyp    Thickened endometrium    Submucous leiomyoma of uterus    Edema 08/05/2019   Hyperlipidemia 08/05/2019   Vitamin D  deficiency 08/05/2019   Polyneuropathy due to type 2 diabetes mellitus (HCC) 08/05/2019   Abdominal pain, lower 06/15/2019   History of fall  05/30/2019   Tinea 02/23/2018   Foot swelling 09/11/2017   Major depressive disorder, recurrent episode, moderate degree (HCC) 09/11/2017   Degeneration of lumbar intervertebral disc 06/23/2017   Hip pain, left 06/13/2017   Routine history and physical examination of adult 03/17/2017   Chronic low back pain 03/18/2016   Pain in joint involving shoulder region 12/25/2015   Other mixed anxiety disorders 12/05/2012   SOB (shortness of breath) 08/17/2010   Morbid obesity (HCC) 08/17/2010   Allergic rhinitis 09/07/2009   Paresthesia 04/08/2009   Esophageal reflux 01/29/2009   Hypertension 01/15/2009   Type 2 diabetes mellitus without complications (HCC) 01/15/2009   Sleep apnea 01/15/2009          Physical Exam   Triage Vital Signs: ED Triage Vitals  Encounter Vitals Group     BP 04/06/24 1423 (!) 163/123     Girls Systolic BP Percentile --      Girls Diastolic BP Percentile --      Boys Systolic BP Percentile --      Boys Diastolic BP Percentile --      Pulse Rate 04/06/24 1423 74     Resp 04/06/24 1423 20     Temp 04/06/24 1423 (!) 97.5 F (36.4 C)     Temp Source 04/06/24 1423 Oral     SpO2 04/06/24 1423 100 %     Weight 04/06/24 1426 240 lb (108.9 kg)     Height 04/06/24 1426 4' 11 (  1.499 m)     Head Circumference --      Peak Flow --      Pain Score 04/06/24 1424 9     Pain Loc --      Pain Education --      Exclude from Growth Chart --     Most recent vital signs: Vitals:   04/06/24 1423 04/06/24 1425  BP: (!) 163/123 (!) 174/89  Pulse: 74   Resp: 20   Temp: (!) 97.5 F (36.4 C)   SpO2: 100%     Physical Exam Vitals and nursing note reviewed.  Constitutional:      General: Awake and alert. No acute distress.    Appearance: Normal appearance. The patient is normal weight.  HENT:     Head: Normocephalic and atraumatic.     Mouth: Mucous membranes are moist.  Eyes:     General: PERRL. Normal EOMs        Right eye: No discharge.        Left eye:  No discharge.     Conjunctiva/sclera: Conjunctivae normal.  Cardiovascular:     Rate and Rhythm: Normal rate.     Pulses: Normal pulses.  Pulmonary:     Effort: Pulmonary effort is normal. No respiratory distress.     Breath sounds: Normal breath sounds.  Abdominal:     Abdomen is soft. There is no abdominal tenderness. No rebound or guarding. No distention. Musculoskeletal:        General: No swelling. Normal range of motion.     Cervical back: Diffuse cervical tenderness, as well as left-sided trapezius muscle tenderness with palpable spasm.  Full range of motion of neck though pain with range of motion.  Able to turn her head completely as well as flex and extend her neck fully. Normal strength and sensation in bilateral upper extremities. Normal grip strength bilaterally.  Normal intrinsic muscle function of the hand bilaterally.  Normal finger spread test. Normal radial pulses bilaterally. Skin:    General: Skin is warm and dry.     Capillary Refill: Capillary refill takes less than 2 seconds.     Findings: No rash.  Neurological:     Mental Status: The patient is awake and alert.   Neurological: GCS 15 alert and oriented x3 Normal speech, no expressive or receptive aphasia or dysarthria Cranial nerves II through XII intact Normal visual fields 5 out of 5 strength in all 4 extremities with intact sensation throughout No extremity drift Normal finger-to-nose testing, no limb or truncal ataxia    ED Results / Procedures / Treatments   Labs (all labs ordered are listed, but only abnormal results are displayed) Labs Reviewed - No data to display   EKG     RADIOLOGY I independently reviewed and interpreted imaging and agree with radiologists findings.     PROCEDURES:  Critical Care performed:   Procedures   MEDICATIONS ORDERED IN ED: Medications  ketorolac  (TORADOL ) 15 MG/ML injection 15 mg (15 mg Intravenous Given 04/06/24 1524)  dexamethasone  (DECADRON )  injection 10 mg (10 mg Intravenous Given 04/06/24 1524)     IMPRESSION / MDM / ASSESSMENT AND PLAN / ED COURSE  I reviewed the triage vital signs and the nursing notes.   Differential diagnosis includes, but is not limited to, degenerative disc disease, muscle strain, muscle spasm, torticollis.  Patient has tenderness to her paracervical spinal muscle area and at the insertion point to her at the base of her skull.  No sudden  onset headache to suggest subarachnoid hemorrhage.  No fever or altered mental status to suggest meningitis.  Her pain is easily reproducible with palpation of the para spinal cervical muscles.  She has normal strength and sensation of bilateral upper extremities, normal grip strength bilaterally, normal intrinsic muscle function of the hands bilaterally, and full and normal range of motion of her bilateral shoulders, I do not suspect central cord syndrome.  There are no radicular type symptoms.  No fever to suggest infectious etiology.  I reviewed the patient's chart.  Patient had a MRI of her cervical spine in July of this year which revealed advanced multilevel cervical spondylosis with severe canal stenosis at C5-C6 and moderate to severe canal stenosis at C4-C5.  She was also found to have severe bilateral foraminal stenosis at C5-C6, C6-C7, and C7-T1, and moderate to severe bilateral foraminal stenosis at C3-C4 and C4-C5.  Patient has seen Dr. Claudene at which time she complained of right upper extremity weakness and right lower extremity weakness.  She is not complaining of this today.  They had planned to undergo C4-C6 anterior cervical discectomy and fusion after a DEXA scan.  Patient was treated with steroids and Toradol .  Upon reevaluation she reports that she feels significantly improved.  She continues to be able to range her arms fully, no weakness noted.  Normal grip strength bilaterally, normal finger spread test.  CT scans obtained reveal her known degenerative  changes, though grossly stable from July.  She has no gait instability or urinary/fecal incontinence or retention.  I recommended close outpatient follow-up with her neurosurgeon.  In the meantime, we will continue the naproxen  and the steroids.  She also requested Lidoderm  patches which were sent to the pharmacy.  She was advised that the steroids can affect her blood sugar and she was encouraged to pay close attention to her blood sugar while taking this medications.  We did discuss strict return precautions at length.  Patient understands and agrees with plan.  She was discharged in stable condition.  Patient's presentation is most consistent with severe exacerbation of chronic illness.   Clinical Course as of 04/06/24 1636  Sat Apr 06, 2024  1613 Patient reports feeling significantly improved [JP]    Clinical Course User Index [JP] Amrit Erck E, PA-C     FINAL CLINICAL IMPRESSION(S) / ED DIAGNOSES   Final diagnoses:  Neck pain  Torticollis, acute     Rx / DC Orders   ED Discharge Orders          Ordered    naproxen  (NAPROSYN ) 500 MG tablet  2 times daily with meals        04/06/24 1615    predniSONE  (STERAPRED UNI-PAK 21 TAB) 10 MG (21) TBPK tablet        04/06/24 1615    lidocaine  (LIDODERM ) 5 %  Every 12 hours        04/06/24 1615             Note:  This document was prepared using Dragon voice recognition software and may include unintentional dictation errors.   Esaias Cleavenger E, PA-C 04/06/24 1636    Arlander Charleston, MD 04/06/24 [phone]

## 2024-04-06 NOTE — ED Triage Notes (Signed)
 Pt to ED for neck pain since 2 days. Woke up with pain and stiffness. Pain radiates on both sides up head. Staes L shoulder also sore and swollen. Pt can barely rotate her head.  Pt did not take her BP meds today. BP elevated in triage, took twice.

## 2024-04-06 NOTE — Discharge Instructions (Signed)
 Your CT scans reveal your degenerative changes which are known already.  Please follow-up with your neurosurgeon.  May take the medications to help with your symptoms in the meantime.  Member that the prednisone can affect your blood sugar so please play close attention to this as you are taking your medications this week.  Please return to the emergency department immediately if you develop new or worsening symptoms such as worsening pain, numbness, tingling, weakness in your extremities or hands, trouble walking, or urinary/fecal incontinence or retention.  It was a pleasure caring for you today.

## 2024-04-16 ENCOUNTER — Telehealth: Payer: Self-pay

## 2024-04-16 NOTE — Telephone Encounter (Signed)
 Pt LM with answering service asking for call back to make an appt, called patient back and LM to call back and schedule something if its still needed

## 2024-04-18 ENCOUNTER — Ambulatory Visit: Admitting: Cardiology

## 2024-04-18 ENCOUNTER — Ambulatory Visit (INDEPENDENT_AMBULATORY_CARE_PROVIDER_SITE_OTHER): Admitting: Family

## 2024-04-18 ENCOUNTER — Encounter: Payer: Self-pay | Admitting: Family

## 2024-04-18 VITALS — BP 130/86 | HR 72 | Ht 59.0 in | Wt 261.0 lb

## 2024-04-18 DIAGNOSIS — F331 Major depressive disorder, recurrent, moderate: Secondary | ICD-10-CM | POA: Diagnosis not present

## 2024-04-18 DIAGNOSIS — E66813 Obesity, class 3: Secondary | ICD-10-CM | POA: Diagnosis not present

## 2024-04-18 DIAGNOSIS — M436 Torticollis: Secondary | ICD-10-CM

## 2024-04-18 DIAGNOSIS — E1159 Type 2 diabetes mellitus with other circulatory complications: Secondary | ICD-10-CM

## 2024-04-18 DIAGNOSIS — M542 Cervicalgia: Secondary | ICD-10-CM | POA: Diagnosis not present

## 2024-04-18 DIAGNOSIS — Z748 Other problems related to care provider dependency: Secondary | ICD-10-CM

## 2024-04-18 DIAGNOSIS — E1142 Type 2 diabetes mellitus with diabetic polyneuropathy: Secondary | ICD-10-CM | POA: Diagnosis not present

## 2024-04-18 DIAGNOSIS — M4712 Other spondylosis with myelopathy, cervical region: Secondary | ICD-10-CM | POA: Diagnosis not present

## 2024-04-18 DIAGNOSIS — Z1231 Encounter for screening mammogram for malignant neoplasm of breast: Secondary | ICD-10-CM | POA: Diagnosis not present

## 2024-04-18 DIAGNOSIS — Z6841 Body Mass Index (BMI) 40.0 and over, adult: Secondary | ICD-10-CM | POA: Diagnosis not present

## 2024-04-18 DIAGNOSIS — Z794 Long term (current) use of insulin: Secondary | ICD-10-CM

## 2024-04-18 DIAGNOSIS — Z13820 Encounter for screening for osteoporosis: Secondary | ICD-10-CM

## 2024-04-18 DIAGNOSIS — I152 Hypertension secondary to endocrine disorders: Secondary | ICD-10-CM

## 2024-04-18 MED ORDER — BACLOFEN 10 MG PO TABS: 10.0000 mg | ORAL_TABLET | Freq: Two times a day (BID) | ORAL | 1 refills | Status: AC

## 2024-04-18 NOTE — Progress Notes (Signed)
 Established Patient Office Visit  Subjective:  Patient ID: April Harrison, female    DOB: 1955/08/20  Age: 68 y.o. MRN: [id number]  Chief Complaint  Patient presents with   Hospitalization Follow-up    Has been sick since thanksgiving.  Woke up with a Crick in her neck, has been causing spiking pain in her neck/back.  Did a CT Scan of her neck, which showed severe degeneration in her C-spine.  She reports that the ONLY thing that has been giving her any relief (though it is minimal) is   Neck Pain  This is a new problem. The current episode started 1 to 4 weeks ago. The problem occurs constantly. The problem has been gradually worsening. The pain is associated with nothing. The pain is present in the anterior neck and occipital region. The quality of the pain is described as stabbing and shooting. The pain is at a severity of 10/10. The pain is severe. The symptoms are aggravated by bending, position, coughing, sneezing and twisting. The pain is Same all the time. Stiffness is present All day. Associated symptoms include headaches and photophobia. She has tried neck support, acetaminophen , heat, muscle relaxants and NSAIDs for the symptoms. The treatment provided mild (neck supports helped with her pain, she says this was the only thing that helped her AT ALL, Regular neck brace was too big, so it did not work. Has been using a travel pillow. But tylenol , NSAIDS have been inefffective.  Burned herself with heating pad.) relief.    No other concerns at this time.   Past Medical History:  Diagnosis Date   Anxiety    Arthritis    Depression    Diabetes mellitus    Type 2   Family history of adverse reaction to anesthesia    sister almost died   History of chicken pox    Hypertension    Measles    Mumps    Osteoarthritis of carpometacarpal (CMC) joint of thumb 01/06/2022   Pain in right hand 01/06/2022   Pain of left hand 01/06/2022   Pancreatitis    Sleep apnea    told that she  doesn't have it now   Stiffness of finger joint of left hand 02/03/2022    Past Surgical History:  Procedure Laterality Date   CESAREAN SECTION     COLONOSCOPY WITH PROPOFOL  N/A 07/21/2017   Procedure: COLONOSCOPY WITH PROPOFOL ;  Surgeon: Unk Corinn Skiff, MD;  Location: [facility];  Service: Gastroenterology;  Laterality: N/A;   HARDWARE REMOVAL Left    knee   HYSTEROSCOPY WITH D & C N/A 08/29/2019   Procedure: DILATATION AND CURETTAGE /HYSTEROSCOPY & MYOSURE POYLPECTOMY AND MYOMECTOMY;  Surgeon: Lake Read, MD;  Location: [facility] ORS;  Service: Gynecology;  Laterality: N/A;   KNEE SURGERY Left     Social History   Socioeconomic History   Marital status: Single    Spouse name: Not on file   Number of children: Not on file   Years of education: Not on file   Highest education level: Not on file  Occupational History   Not on file  Tobacco Use   Smoking status: Never   Smokeless tobacco: Never  Vaping Use   Vaping status: Never Used  Substance and Sexual Activity   Alcohol use: No   Drug use: No   Sexual activity: Not Currently    Birth control/protection: None  Other Topics Concern   Not on file  Social History Narrative   ** Merged  History Encounter **       Social Drivers of Health   Tobacco Use: Low Risk (04/18/2024)   Patient History    Smoking Tobacco Use: Never    Smokeless Tobacco Use: Never    Passive Exposure: Not on file  Financial Resource Strain: Not on file  Food Insecurity: Not on file  Transportation Needs: Not on file  Physical Activity: Not on file  Stress: Not on file  Social Connections: Not on file  Intimate Partner Violence: Not on file  Depression (PHQ2-9): High Risk (08/15/2023)   Depression (PHQ2-9)    PHQ-2 Score: 19  Alcohol Screen: Not on file  Housing: Not on file  Utilities: Not on file  Health Literacy: Not on file    Family History  Problem Relation Age of Onset   Breast cancer Neg Hx      Allergies[1]  Review of Systems  Eyes:  Positive for photophobia.  Musculoskeletal:  Positive for neck pain.  Neurological:  Positive for headaches.  All other systems reviewed and are negative.      Objective:   BP 130/86   Pulse 72   Ht 4' 11 (1.499 m)   Wt 261 lb (118.4 kg)   SpO2 98%   BMI 52.72 kg/m   Vitals:   04/18/24 1052  BP: 130/86  Pulse: 72  Height: 4' 11 (1.499 m)  Weight: 261 lb (118.4 kg)  SpO2: 98%  BMI (Calculated): 52.69    Physical Exam Vitals and nursing note reviewed.  Constitutional:      Appearance: Normal appearance. She is obese. She is ill-appearing. She is not toxic-appearing.     Comments: Patient appears very uncomfortable.  Holding head in hands asks if we can turn off overhead lights due to brightness.    HENT:     Head: Normocephalic.  Eyes:     Extraocular Movements: Extraocular movements intact.     Conjunctiva/sclera: Conjunctivae normal.     Pupils: Pupils are equal, round, and reactive to light.  Cardiovascular:     Rate and Rhythm: Normal rate.  Pulmonary:     Effort: Pulmonary effort is normal.  Musculoskeletal:     Cervical back: Torticollis and crepitus present. Pain with movement, spinous process tenderness and muscular tenderness present. Decreased range of motion.  Neurological:     General: No focal deficit present.     Mental Status: She is alert and oriented to person, place, and time. Mental status is at baseline.  Psychiatric:        Mood and Affect: Mood normal.        Behavior: Behavior normal.        Thought Content: Thought content normal.        Judgment: Judgment normal.      No results found for any visits on 04/18/24.  Recent Results (from the past 2160 hours)  CMP14+EGFR     Status: Abnormal   Collection Time: 02/28/24  1:52 PM  Result Value Ref Range   Glucose 125 (H) 70 - 99 mg/dL   BUN 15 8 - 27 mg/dL   Creatinine, Ser 8.84 (H) 0.57 - 1.00 mg/dL   eGFR 52 (L) >40 fO/fpw/8.26    BUN/Creatinine Ratio 13 12 - 28   Sodium 144 134 - 144 mmol/L   Potassium 4.5 3.5 - 5.2 mmol/L   Chloride 103 96 - 106 mmol/L   CO2 28 20 - 29 mmol/L   Calcium 9.5 8.7 - 10.3 mg/dL   Total  Protein 6.7 6.0 - 8.5 g/dL   Albumin 4.1 3.9 - 4.9 g/dL   Globulin, Total 2.6 1.5 - 4.5 g/dL   Bilirubin Total 0.3 0.0 - 1.2 mg/dL   Alkaline Phosphatase 91 49 - 135 IU/L   AST 14 0 - 40 IU/L   ALT 9 0 - 32 IU/L  Lipid panel     Status: Abnormal   Collection Time: 02/28/24  1:52 PM  Result Value Ref Range   Cholesterol, Total 109 100 - 199 mg/dL   Triglycerides 82 0 - 149 mg/dL   HDL 39 (L) >60 mg/dL   VLDL Cholesterol Cal 16 5 - 40 mg/dL   LDL Chol Calc (NIH) 54 0 - 99 mg/dL   Chol/HDL Ratio 2.8 0.0 - 4.4 ratio    Comment:                                   T. Chol/HDL Ratio                                             Men  Women                               1/2 Avg.Risk  3.4    3.3                                   Avg.Risk  5.0    4.4                                2X Avg.Risk  9.6    7.1                                3X Avg.Risk 23.4   11.0   Iron, TIBC and Ferritin Panel     Status: Abnormal   Collection Time: 02/28/24  1:52 PM  Result Value Ref Range   Total Iron Binding Capacity 334 250 - 450 ug/dL   UIBC 707 881 - 630 ug/dL   Iron 42 27 - 860 ug/dL   Iron Saturation 13 (L) 15 - 55 %   Ferritin 21 15 - 150 ng/mL  VITAMIN D  25 Hydroxy (Vit-D Deficiency, Fractures)     Status: None   Collection Time: 02/28/24  1:52 PM  Result Value Ref Range   Vit D, 25-Hydroxy 62.1 30.0 - 100.0 ng/mL    Comment: Vitamin D  deficiency has been defined by the Institute of Medicine and an Endocrine Society practice guideline as a level of serum 25-OH vitamin D  less than 20 ng/mL (1,2). The Endocrine Society went on to further define vitamin D  insufficiency as a level between 21 and 29 ng/mL (2). 1. IOM (Institute of Medicine). 2010. Dietary reference    intakes for calcium and D. [place]:  The    [employer]. 2. Holick MF, Binkley [place], Bischoff-Ferrari HA, et al.    Evaluation, treatment, and prevention of vitamin D     deficiency: an Endocrine Society clinical practice    guideline. JCEM. 2011 Jul; 96(7):1911-30.  Vitamin B12     Status: None   Collection Time: 02/28/24  1:52 PM  Result Value Ref Range   Vitamin B-12 400 232 - 1,245 pg/mL  CBC with Diff     Status: Abnormal   Collection Time: 02/28/24  1:52 PM  Result Value Ref Range   WBC 8.3 3.4 - 10.8 x10E3/uL   RBC 4.94 3.77 - 5.28 x10E6/uL   Hemoglobin 12.7 11.1 - 15.9 g/dL   Hematocrit 60.4 65.9 - 46.6 %   MCV 80 79 - 97 fL   MCH 25.7 (L) 26.6 - 33.0 pg   MCHC 32.2 31.5 - 35.7 g/dL   RDW 85.0 88.2 - 84.5 %   Platelets 169 150 - 450 x10E3/uL   Neutrophils 72 Not Estab. %   Lymphs 16 Not Estab. %   Monocytes 7 Not Estab. %   Eos 4 Not Estab. %   Basos 1 Not Estab. %   Neutrophils Absolute 6.0 1.4 - 7.0 x10E3/uL   Lymphocytes Absolute 1.3 0.7 - 3.1 x10E3/uL   Monocytes Absolute 0.6 0.1 - 0.9 x10E3/uL   EOS (ABSOLUTE) 0.3 0.0 - 0.4 x10E3/uL   Basophils Absolute 0.1 0.0 - 0.2 x10E3/uL   Immature Granulocytes 0 Not Estab. %   Immature Grans (Abs) 0.0 0.0 - 0.1 x10E3/uL  Hemoglobin A1c     Status: Abnormal   Collection Time: 02/28/24  1:52 PM  Result Value Ref Range   Hgb A1c MFr Bld 6.0 (H) 4.8 - 5.6 %    Comment:          Prediabetes: 5.7 - 6.4          Diabetes: >6.4          Glycemic control for adults with diabetes: <7.0    Est. average glucose Bld gHb Est-mCnc 126 mg/dL  TSH     Status: None   Collection Time: 02/28/24  1:52 PM  Result Value Ref Range   TSH 1.470 0.450 - 4.500 uIU/mL       Assessment & Plan Major depressive disorder, recurrent episode, moderate degree (HCC) Patient stable.  Well controlled with current therapy.   Continue current meds.   Class 3 severe obesity due to excess calories with serious comorbidity and body mass index (BMI) of 50.0 to 59.9 in adult  [facility]) Continue current meds.  Will adjust as needed based on results.  The patient is asked to make an attempt to improve diet and exercise patterns to aid in medical management of this problem. Addressed importance of increasing and maintaining water intake.   Osteoporosis screening DEXA scan ordered today.  Will call with results when available.   Hypertension associated with type 2 diabetes mellitus (HCC) Blood pressure well controlled with current medications.  Continue current therapy.  Will reassess at follow up.   Type 2 diabetes mellitus with diabetic polyneuropathy, with long-term current use of insulin (HCC) Continue current diabetes POC, as patient has been well controlled on current regimen.  Will adjust meds if needed based on labs.   Screening mammogram for breast cancer Mammogram ordered today.  Patient given contact information to call and schedule appointment.   Torticollis Spondylosis, cervical, with myelopathy Neck pain Given the acuity of her symptoms and that the only thing giving her relief is a support device, I will set patient up for urgent referral to Neurosurgery.  Will defer to them for further treatment changes.  Reassess at follow up.  Assistance needed with transportation Printed out [facility]  application for pt and completed provider portion.  Patient provided with paperwork to return to them.     Return in about 6 weeks (around 05/30/2024) for AWV.   Total time spent: 40 minutes  ALAN CHRISTELLA ARRANT, FNP  04/18/2024   This document may have been prepared by [facility] Voice Recognition software and as such may include unintentional dictation errors.      [1]  Allergies Allergen Reactions   Penicillin G     Other reaction(s): Unknown   Gabapentin Rash

## 2024-04-18 NOTE — Patient Instructions (Addendum)
 1) SCHEDULE YOUR MAMMOGRAM:   [facility] at [facility] [address] [facility] [place],  [place]  [address] Main: [phone]  2) SCHEDULE YOUR EYE EXAM: [facility] Address: [address], [place], [place] [address] Phone: [phone]   3) I AM SENDING AN URGENT REFERRAL TO NEUROSURGERY.  THEY SHOULD CALL YOU, BUT IF YOU DO NOT HEAR FROM THEM, CALL THEM TO SET UP APPOINTMENT.   [facility] at [facility] Address: [address], [place], [place] [address] Phone: [phone]  4) SET UP BONE DENSITY SCAN TODAY

## 2024-04-18 NOTE — Assessment & Plan Note (Signed)
 Patient stable.  Well controlled with current therapy.   Continue current meds.

## 2024-04-18 NOTE — Assessment & Plan Note (Signed)
 Given the acuity of her symptoms and that the only thing giving her relief is a support device, I will set patient up for urgent referral to Neurosurgery.  Will defer to them for further treatment changes.  Reassess at follow up.

## 2024-04-18 NOTE — Assessment & Plan Note (Signed)
 Continue current meds.  Will adjust as needed based on results.  The patient is asked to make an attempt to improve diet and exercise patterns to aid in medical management of this problem. Addressed importance of increasing and maintaining water  intake.

## 2024-04-18 NOTE — Assessment & Plan Note (Signed)
 DEXA scan ordered today.  Will call with results when available.

## 2024-04-18 NOTE — Assessment & Plan Note (Signed)
 Continue current diabetes POC, as patient has been well controlled on current regimen.  Will adjust meds if needed based on labs.

## 2024-04-18 NOTE — Assessment & Plan Note (Signed)
 Blood pressure well controlled with current medications.  Continue current therapy.  Will reassess at follow up.

## 2024-05-03 ENCOUNTER — Other Ambulatory Visit: Payer: Self-pay | Admitting: Family

## 2024-05-08 ENCOUNTER — Encounter: Payer: Self-pay | Admitting: Family

## 2024-05-20 ENCOUNTER — Telehealth

## 2024-05-20 NOTE — Patient Instructions (Signed)
 April Harrison - I am sorry I was unable to reach you today for our scheduled appointment. I work with Orlean Alan HERO, FNP and am calling to support your healthcare needs. Please contact me at [phone] at your earliest convenience. I look forward to speaking with you soon.   Thank you,  Hendricks Her RN, BSN  [place] [facility] RN Case Manager   Direct [phone]

## 2024-05-23 ENCOUNTER — Other Ambulatory Visit: Payer: Self-pay

## 2024-05-23 NOTE — Patient Instructions (Signed)
 Zamariah A Brase - I have attempted to call you three times but have been unsuccessful in reaching you. I work with Orlean Alan HERO, FNP and am calling to support your healthcare needs. If I can be of assistance to you, please contact me at [phone].     Thank you,  Hendricks Her RN, BSN  [place] [facility] RN Case Manager   Direct [phone]

## 2024-05-27 ENCOUNTER — Telehealth: Payer: Self-pay

## 2024-05-27 NOTE — Telephone Encounter (Signed)
 We received a fax from [facility] stating that the patient gave verbal consent to have their medications sent here, please call the patient to confirm this to be true and send back to me to send to the correct providers. Fax says verbal consent was given November 11th

## 2024-05-29 ENCOUNTER — Ambulatory Visit
Admission: RE | Admit: 2024-05-29 | Discharge: 2024-05-29 | Disposition: A | Discharge status: Home / Self Care | Source: Ambulatory Visit | Attending: Family | Admitting: Family

## 2024-05-29 DIAGNOSIS — Z780 Asymptomatic menopausal state: Secondary | ICD-10-CM | POA: Diagnosis not present

## 2024-05-29 DIAGNOSIS — Z13820 Encounter for screening for osteoporosis: Secondary | ICD-10-CM | POA: Insufficient documentation

## 2024-05-30 ENCOUNTER — Ambulatory Visit: Admitting: Family

## 2024-05-30 ENCOUNTER — Other Ambulatory Visit: Payer: Self-pay | Admitting: Family

## 2024-05-30 ENCOUNTER — Ambulatory Visit: Payer: Self-pay

## 2024-05-30 ENCOUNTER — Encounter: Payer: Self-pay | Admitting: Family

## 2024-05-30 ENCOUNTER — Other Ambulatory Visit: Payer: Self-pay

## 2024-05-30 VITALS — BP 124/84 | HR 78 | Ht 59.0 in | Wt 264.4 lb

## 2024-05-30 DIAGNOSIS — N6320 Unspecified lump in the left breast, unspecified quadrant: Secondary | ICD-10-CM

## 2024-05-30 DIAGNOSIS — M1A00X0 Idiopathic chronic gout, unspecified site, without tophus (tophi): Secondary | ICD-10-CM

## 2024-05-30 DIAGNOSIS — N760 Acute vaginitis: Secondary | ICD-10-CM

## 2024-05-30 DIAGNOSIS — E538 Deficiency of other specified B group vitamins: Secondary | ICD-10-CM

## 2024-05-30 DIAGNOSIS — E559 Vitamin D deficiency, unspecified: Secondary | ICD-10-CM

## 2024-05-30 DIAGNOSIS — E66813 Obesity, class 3: Secondary | ICD-10-CM

## 2024-05-30 DIAGNOSIS — Z1159 Encounter for screening for other viral diseases: Secondary | ICD-10-CM

## 2024-05-30 DIAGNOSIS — E782 Mixed hyperlipidemia: Secondary | ICD-10-CM

## 2024-05-30 DIAGNOSIS — G4733 Obstructive sleep apnea (adult) (pediatric): Secondary | ICD-10-CM

## 2024-05-30 DIAGNOSIS — E1142 Type 2 diabetes mellitus with diabetic polyneuropathy: Secondary | ICD-10-CM

## 2024-05-30 DIAGNOSIS — R300 Dysuria: Secondary | ICD-10-CM

## 2024-05-30 DIAGNOSIS — I152 Hypertension secondary to endocrine disorders: Secondary | ICD-10-CM

## 2024-05-30 DIAGNOSIS — R5383 Other fatigue: Secondary | ICD-10-CM

## 2024-05-30 LAB — POCT URINALYSIS DIPSTICK
Bilirubin, UA: NEGATIVE
Glucose, UA: NEGATIVE
Ketones, UA: NEGATIVE
Nitrite, UA: POSITIVE
Protein, UA: NEGATIVE
Spec Grav, UA: 1.025
Urobilinogen, UA: 0.2 U/dL
pH, UA: 5.5

## 2024-05-30 LAB — POCT CBG (FASTING - GLUCOSE)-MANUAL ENTRY: Glucose Fasting, POC: 152 mg/dL — AB (ref 70–99)

## 2024-05-30 LAB — POC CREATINE & ALBUMIN,URINE
Albumin/Creatinine Ratio, Urine, POC: <30
Creatinine, POC: 300 mg/dL
Microalbumin Ur, POC: 30 mg/L

## 2024-05-30 MED ORDER — ALBUTEROL SULFATE HFA 108 (90 BASE) MCG/ACT IN AERS: 2.0000 | INHALATION_SPRAY | Freq: Four times a day (QID) | RESPIRATORY_TRACT | 2 refills | Status: AC | PRN

## 2024-05-30 MED ORDER — BUSPIRONE HCL 5 MG PO TABS: 5.0000 mg | ORAL_TABLET | Freq: Two times a day (BID) | ORAL | 0 refills | Status: DC

## 2024-05-30 MED ORDER — MOUNJARO 12.5 MG/0.5ML ~~LOC~~ SOAJ: 12.5000 mg | SUBCUTANEOUS | 11 refills | Status: DC

## 2024-05-30 MED ORDER — TIRZEPATIDE-WEIGHT MANAGEMENT 15 MG/0.5ML ~~LOC~~ SOAJ: 15.0000 mg | SUBCUTANEOUS | 3 refills | Status: DC

## 2024-05-30 MED ORDER — TIRZEPATIDE-WEIGHT MANAGEMENT 15 MG/0.5ML ~~LOC~~ SOAJ: 15.0000 mg | SUBCUTANEOUS | 3 refills | Status: AC

## 2024-05-30 MED ORDER — TRIJARDY XR 25-5-1000 MG PO TB24: 1.0000 | ORAL_TABLET | Freq: Every day | ORAL | 2 refills | Status: AC

## 2024-05-30 MED ORDER — LISINOPRIL-HYDROCHLOROTHIAZIDE 20-25 MG PO TABS: 1.0000 | ORAL_TABLET | Freq: Every day | ORAL | 2 refills | Status: AC

## 2024-05-30 MED ORDER — VITAMIN D (ERGOCALCIFEROL) 1.25 MG (50000 UNIT) PO CAPS: 50000.0000 [IU] | ORAL_CAPSULE | ORAL | 3 refills | Status: AC

## 2024-05-30 MED ORDER — NYSTATIN 100000 UNIT/GM EX POWD: 1.0000 | Freq: Two times a day (BID) | CUTANEOUS | 0 refills | Status: AC

## 2024-05-30 MED ORDER — FUROSEMIDE 40 MG PO TABS: 40.0000 mg | ORAL_TABLET | Freq: Every day | ORAL | 2 refills | Status: AC

## 2024-05-30 NOTE — Telephone Encounter (Signed)
 Rxs have been sent to [facility] - ao

## 2024-05-30 NOTE — Patient Instructions (Addendum)
 1) Call Dr. Tobie.   2) [email or web address] - this is the website we were talking about for glasses.  Get your son or someone computer savvy to help you with this.   3) Mammogram - new order has been sent to the pharmacy for you.

## 2024-05-31 LAB — CBC WITH DIFFERENTIAL/PLATELET
Basophils Absolute: 0.1 x10E3/uL (ref 0.0–0.2)
Basos: 1 %
EOS (ABSOLUTE): 0.3 x10E3/uL (ref 0.0–0.4)
Eos: 4 %
Hematocrit: 41.1 % (ref 34.0–46.6)
Hemoglobin: 13.3 g/dL (ref 11.1–15.9)
Immature Grans (Abs): 0 x10E3/uL (ref 0.0–0.1)
Immature Granulocytes: 0 %
Lymphocytes Absolute: 1.4 x10E3/uL (ref 0.7–3.1)
Lymphs: 19 %
MCH: 26.3 pg — ABNORMAL LOW (ref 26.6–33.0)
MCHC: 32.4 g/dL (ref 31.5–35.7)
MCV: 81 fL (ref 79–97)
Monocytes Absolute: 0.7 x10E3/uL (ref 0.1–0.9)
Monocytes: 9 %
Neutrophils Absolute: 5 x10E3/uL (ref 1.4–7.0)
Neutrophils: 67 %
Platelets: 165 x10E3/uL (ref 150–450)
RBC: 5.05 x10E6/uL (ref 3.77–5.28)
RDW: 15.9 % — ABNORMAL HIGH (ref 11.7–15.4)
WBC: 7.4 x10E3/uL (ref 3.4–10.8)

## 2024-05-31 LAB — URINALYSIS, ROUTINE W REFLEX MICROSCOPIC
Bilirubin, UA: NEGATIVE
Glucose, UA: NEGATIVE
Ketones, UA: NEGATIVE
Nitrite, UA: POSITIVE — AB
Protein,UA: NEGATIVE
RBC, UA: NEGATIVE
Specific Gravity, UA: 1.02 (ref 1.005–1.030)
Urobilinogen, Ur: 0.2 mg/dL (ref 0.2–1.0)
pH, UA: 5.5 (ref 5.0–7.5)

## 2024-05-31 LAB — CMP14+EGFR
ALT: 14 IU/L (ref 0–32)
AST: 19 IU/L (ref 0–40)
Albumin: 4.1 g/dL (ref 3.9–4.9)
Alkaline Phosphatase: 93 IU/L (ref 49–135)
BUN/Creatinine Ratio: 18 (ref 12–28)
BUN: 16 mg/dL (ref 8–27)
Bilirubin Total: 0.4 mg/dL (ref 0.0–1.2)
CO2: 24 mmol/L (ref 20–29)
Calcium: 9.6 mg/dL (ref 8.7–10.3)
Chloride: 103 mmol/L (ref 96–106)
Creatinine, Ser: 0.88 mg/dL (ref 0.57–1.00)
Globulin, Total: 2.5 g/dL (ref 1.5–4.5)
Glucose: 131 mg/dL — ABNORMAL HIGH (ref 70–99)
Potassium: 4.3 mmol/L (ref 3.5–5.2)
Sodium: 144 mmol/L (ref 134–144)
Total Protein: 6.6 g/dL (ref 6.0–8.5)
eGFR: 72 mL/min/1.73

## 2024-05-31 LAB — HCV INTERPRETATION

## 2024-05-31 LAB — LIPID PANEL
Chol/HDL Ratio: 2.7 ratio (ref 0.0–4.4)
Cholesterol, Total: 176 mg/dL (ref 100–199)
HDL: 66 mg/dL
LDL Chol Calc (NIH): 84 mg/dL (ref 0–99)
Triglycerides: 156 mg/dL — ABNORMAL HIGH (ref 0–149)
VLDL Cholesterol Cal: 26 mg/dL (ref 5–40)

## 2024-05-31 LAB — MICROSCOPIC EXAMINATION: Casts: NONE SEEN /LPF

## 2024-05-31 LAB — TSH: TSH: 1.26 u[IU]/mL (ref 0.450–4.500)

## 2024-05-31 LAB — HEMOGLOBIN A1C
Est. average glucose Bld gHb Est-mCnc: 174 mg/dL
Hgb A1c MFr Bld: 7.7 % — ABNORMAL HIGH (ref 4.8–5.6)

## 2024-05-31 LAB — IRON,TIBC AND FERRITIN PANEL
Ferritin: 97 ng/mL (ref 15–150)
Iron Saturation: 21 % (ref 15–55)
Iron: 66 ug/dL (ref 27–139)
Total Iron Binding Capacity: 313 ug/dL (ref 250–450)
UIBC: 247 ug/dL (ref 118–369)

## 2024-05-31 LAB — CORTISOL: Cortisol: 5.3 ug/dL — ABNORMAL LOW (ref 6.2–19.4)

## 2024-05-31 LAB — DHEA-SULFATE: DHEA-SO4: 30.5 ug/dL (ref 20.4–186.6)

## 2024-05-31 LAB — VITAMIN B12: Vitamin B-12: 499 pg/mL (ref 232–1245)

## 2024-05-31 LAB — URIC ACID: Uric Acid: 6.4 mg/dL (ref 3.0–7.2)

## 2024-05-31 LAB — VITAMIN D 25 HYDROXY (VIT D DEFICIENCY, FRACTURES): Vit D, 25-Hydroxy: 34.6 ng/mL (ref 30.0–100.0)

## 2024-05-31 LAB — HCV AB W REFLEX TO QUANT PCR: HCV Ab: NONREACTIVE

## 2024-06-01 LAB — NUSWAB VAGINITIS PLUS (VG+)
Candida albicans, NAA: POSITIVE — AB
Candida glabrata, NAA: POSITIVE — AB
Chlamydia trachomatis, NAA: NEGATIVE
Neisseria gonorrhoeae, NAA: NEGATIVE
Trich vag by NAA: NEGATIVE

## 2024-06-01 LAB — URINE CULTURE

## 2024-06-10 ENCOUNTER — Other Ambulatory Visit: Payer: Self-pay | Admitting: Family

## 2024-06-11 ENCOUNTER — Encounter: Admitting: Physician Assistant

## 2024-06-12 ENCOUNTER — Other Ambulatory Visit: Payer: Self-pay | Admitting: Family

## 2024-06-12 MED ORDER — TIRZEPATIDE 15 MG/0.5ML ~~LOC~~ SOAJ: 15.0000 mg | SUBCUTANEOUS | 1 refills | Status: AC

## 2024-06-13 ENCOUNTER — Other Ambulatory Visit: Payer: Self-pay

## 2024-06-13 MED ORDER — NITROFURANTOIN MONOHYD MACRO 100 MG PO CAPS: 100.0000 mg | ORAL_CAPSULE | Freq: Two times a day (BID) | ORAL | 0 refills | Status: AC

## 2024-06-13 MED ORDER — FLUCONAZOLE 150 MG PO TABS: 150.0000 mg | ORAL_TABLET | Freq: Every day | ORAL | 0 refills | Status: AC

## 2024-06-14 ENCOUNTER — Ambulatory Visit: Admission: RE | Admit: 2024-06-14 | Source: Ambulatory Visit

## 2024-06-14 ENCOUNTER — Inpatient Hospital Stay: Admission: RE | Admit: 2024-06-14 | Source: Ambulatory Visit

## 2024-06-14 DIAGNOSIS — N6320 Unspecified lump in the left breast, unspecified quadrant: Secondary | ICD-10-CM

## 2024-07-01 ENCOUNTER — Ambulatory Visit: Admitting: Family

## 2024-07-02 ENCOUNTER — Ambulatory Visit: Admitting: Family
